# Patient Record
Sex: Female | Born: 1970 | Race: White | Hispanic: No | Marital: Married | Smoking: Never smoker
Health system: Southern US, Community
[De-identification: ages and names within clinical notes are randomized; demographics above are authoritative.]

## PROBLEM LIST (undated history)

## (undated) DIAGNOSIS — K219 Gastro-esophageal reflux disease without esophagitis: Secondary | ICD-10-CM

## (undated) DIAGNOSIS — F419 Anxiety disorder, unspecified: Secondary | ICD-10-CM

## (undated) DIAGNOSIS — T7840XA Allergy, unspecified, initial encounter: Secondary | ICD-10-CM

## (undated) DIAGNOSIS — J45909 Unspecified asthma, uncomplicated: Secondary | ICD-10-CM

## (undated) HISTORY — DX: Allergy, unspecified, initial encounter: T78.40XA

## (undated) HISTORY — PX: INNER EAR SURGERY: SHX679

## (undated) HISTORY — DX: Gastro-esophageal reflux disease without esophagitis: K21.9

## (undated) HISTORY — DX: Anxiety disorder, unspecified: F41.9

---

## 2004-12-30 ENCOUNTER — Ambulatory Visit: Payer: Self-pay

## 2005-10-20 ENCOUNTER — Ambulatory Visit: Payer: Self-pay | Admitting: Unknown Physician Specialty

## 2005-12-31 ENCOUNTER — Ambulatory Visit: Payer: Self-pay

## 2007-01-04 ENCOUNTER — Ambulatory Visit: Payer: Self-pay

## 2008-09-04 ENCOUNTER — Ambulatory Visit: Payer: Self-pay | Admitting: Obstetrics and Gynecology

## 2009-10-30 ENCOUNTER — Ambulatory Visit: Payer: Self-pay

## 2010-11-05 ENCOUNTER — Ambulatory Visit: Payer: Self-pay

## 2010-12-10 ENCOUNTER — Ambulatory Visit: Payer: Self-pay | Admitting: Internal Medicine

## 2010-12-23 ENCOUNTER — Ambulatory Visit: Payer: Self-pay | Admitting: Gastroenterology

## 2011-11-12 ENCOUNTER — Ambulatory Visit: Payer: Self-pay | Admitting: Internal Medicine

## 2012-06-08 ENCOUNTER — Other Ambulatory Visit: Payer: Self-pay | Admitting: Internal Medicine

## 2012-06-08 ENCOUNTER — Telehealth: Payer: Self-pay | Admitting: Internal Medicine

## 2012-06-08 MED ORDER — SCOPOLAMINE 1 MG/3DAYS TD PT72: 1.0000 | MEDICATED_PATCH | TRANSDERMAL | Status: DC

## 2012-06-08 NOTE — Telephone Encounter (Signed)
Ok to fill scopolamine patches - change q 72 hours.   One box with no refills.

## 2012-06-08 NOTE — Telephone Encounter (Signed)
I called this patient and explained to her - not a lot of other options for sea sickness/motion sickness.  Discussed with pharmacy.  Only other option is Meclizine.  Discussed possible side effects and dosing of meds.  Will call back if problems.

## 2012-06-08 NOTE — Telephone Encounter (Signed)
Patient advised via telephone, Rx sent to [facility] pharmacy.

## 2012-06-08 NOTE — Telephone Encounter (Signed)
Pt is needing a refill on her Motion sickness patches. She is leaving next week for a cruise and is needing some.

## 2012-06-08 NOTE — Telephone Encounter (Signed)
Pt called stating she went to pick up rx and it was $98 and wanted to know if you could give her something less expensive

## 2012-07-20 ENCOUNTER — Encounter: Payer: Self-pay | Admitting: Internal Medicine

## 2012-07-20 ENCOUNTER — Ambulatory Visit (INDEPENDENT_AMBULATORY_CARE_PROVIDER_SITE_OTHER): Payer: 59 | Admitting: Internal Medicine

## 2012-07-20 VITALS — BP 109/76 | HR 72 | Temp 98.0°F | Ht 64.0 in | Wt 173.0 lb

## 2012-07-20 DIAGNOSIS — N951 Menopausal and female climacteric states: Secondary | ICD-10-CM

## 2012-07-20 DIAGNOSIS — Z139 Encounter for screening, unspecified: Secondary | ICD-10-CM

## 2012-07-20 MED ORDER — ESCITALOPRAM OXALATE 20 MG PO TABS: ORAL_TABLET | ORAL | Status: DC

## 2012-07-20 MED ORDER — ALPRAZOLAM 0.25 MG PO TABS: 0.2500 mg | ORAL_TABLET | Freq: Every day | ORAL | Status: DC | PRN

## 2012-07-20 NOTE — Progress Notes (Signed)
  Subjective:    Patient ID: Tasha Woods, female    DOB: [birth year], 41 y.o.   MRN: [id number]  HPI 41 year old female with past history of Crohn's disease who comes in today for a scheduled follow up. States that her son was recently diagnosed with ADHD.  Coping with this.  Feels overwhelmed at times, but overall feels she is handling things relatively well.  Feels the current dose of Lexapro is adequate.  She does feel that occasionally she may need a xanax.  Has taken this previously and tolerated.  Is having some increased drainage now.  Occasional ear pressure, but no significant ear pain.  No sinus pressure or fever.  No chest congestion or sob.    Past Medical History  Diagnosis Date  . Crohn's disease 55    Dr Tiffany Kocher    Review of Systems Patient denies any headache, lightheadedness or dizziness.  Increased drainage as outlined.   No chest pain, tightness or palpitations.  No increased shortness of breath, cough or congestion.  No nausea or vomiting.  No acid reflux.   No abdominal pain or cramping.  No bowel change, such as diarrhea, constipation, BRBPR or melana.  No urine change.  Increased stress as outlined.       Objective:   Physical Exam Filed Vitals:   07/20/12 0812  BP: 109/76  Pulse: 72  Temp: 98 F (84.17 C)   41 year old female in no acute distress.   HEENT:  Nares - clear except slightly erythematous turbinates.  No significant tenderness to palpation over the sinuses.  OP- without lesions or erythema.  NECK:  Supple, nontender.  No audible abdominal bruit.   HEART:  Appears to be regular. LUNGS:  Without crackles or wheezing audible.  Respirations even and unlabored.   RADIAL PULSE:  Equal bilaterally.  ABDOMEN:  Soft, nontender.  No audible abdominal bruit.   EXTREMITIES:  No increased edema to be present.                     Assessment & Plan:  INCREASED PSYCHOSOCIAL STRESSORS.  Continue Lexapro at current dosage.  Will give her a prescription for Xanax  .73m to take daily prn.  (#20 with no refills).  Discussed counseling.  She feels she has adequate support.    INCREASED DRAINAGE.  Flonase nasal spray as directed.  Saline flushes as directed.  Hold on abx.  Follow.  Notify me if any worsening symptoms.   GYN.  Pap negative 11/24/11.    GI.  Currently asymptomatic.  No family history of colon cancer.  Has been previously diagnosed with Crohn's.  Discussed need for colon follow up.  Asymptomatic.   Consider referral to Dr ETiffany Kocherwhen agreeable.     HEALTH MAINTENANCE.  Physical 11/24/11.  Mammogram 11/02/11- [facility].  Schedule follow up mammogram.

## 2012-07-20 NOTE — Patient Instructions (Addendum)
It was nice seeing you today.  I am going to give you xanax to have as needed - as we discussed.  Let me know if you need anything else.

## 2012-07-22 ENCOUNTER — Other Ambulatory Visit: Payer: Self-pay | Admitting: *Deleted

## 2012-07-25 MED ORDER — ALPRAZOLAM 0.25 MG PO TABS: 0.2500 mg | ORAL_TABLET | Freq: Every day | ORAL | Status: DC | PRN

## 2012-07-27 ENCOUNTER — Telehealth: Payer: Self-pay | Admitting: *Deleted

## 2012-07-27 NOTE — Telephone Encounter (Signed)
Called patient to check and see if she had ever received her rx, patient stated that she had.

## 2012-08-07 ENCOUNTER — Encounter: Payer: Self-pay | Admitting: Internal Medicine

## 2012-08-07 DIAGNOSIS — N951 Menopausal and female climacteric states: Secondary | ICD-10-CM | POA: Insufficient documentation

## 2012-08-07 NOTE — Assessment & Plan Note (Signed)
Doing better on the Estradiol.  On Lexapro.  Follow.

## 2012-08-28 ENCOUNTER — Telehealth: Payer: Self-pay | Admitting: Internal Medicine

## 2012-08-28 NOTE — Telephone Encounter (Signed)
I reviewed her outside records.  She is currently scheduled for a physical here 11/17/12.  Her last physical was 11/24/11.  Please move her physical to after 3/226/14 and notify pt had to move for insurance - so physical would be more than one year.  Thanks.

## 2012-08-29 NOTE — Telephone Encounter (Signed)
rescheduled

## 2012-11-14 ENCOUNTER — Ambulatory Visit: Payer: Self-pay | Admitting: Internal Medicine

## 2012-11-17 ENCOUNTER — Encounter: Payer: 59 | Admitting: Internal Medicine

## 2012-11-24 ENCOUNTER — Ambulatory Visit (INDEPENDENT_AMBULATORY_CARE_PROVIDER_SITE_OTHER): Payer: 59 | Admitting: Internal Medicine

## 2012-11-24 ENCOUNTER — Encounter: Payer: Self-pay | Admitting: Internal Medicine

## 2012-11-24 ENCOUNTER — Other Ambulatory Visit (HOSPITAL_COMMUNITY)
Admission: RE | Admit: 2012-11-24 | Discharge: 2012-11-24 | Disposition: A | Discharge status: Home / Self Care | Payer: 59 | Source: Ambulatory Visit | Attending: Internal Medicine | Admitting: Internal Medicine

## 2012-11-24 VITALS — BP 100/60 | HR 70 | Temp 98.4°F | Ht 64.0 in | Wt 174.5 lb

## 2012-11-24 DIAGNOSIS — Z13220 Encounter for screening for lipoid disorders: Secondary | ICD-10-CM

## 2012-11-24 DIAGNOSIS — Z9109 Other allergy status, other than to drugs and biological substances: Secondary | ICD-10-CM

## 2012-11-24 DIAGNOSIS — Z124 Encounter for screening for malignant neoplasm of cervix: Secondary | ICD-10-CM

## 2012-11-24 DIAGNOSIS — Z1151 Encounter for screening for human papillomavirus (HPV): Secondary | ICD-10-CM | POA: Insufficient documentation

## 2012-11-24 DIAGNOSIS — N951 Menopausal and female climacteric states: Secondary | ICD-10-CM

## 2012-11-24 DIAGNOSIS — Z01419 Encounter for gynecological examination (general) (routine) without abnormal findings: Secondary | ICD-10-CM | POA: Insufficient documentation

## 2012-11-24 MED ORDER — FLUTICASONE PROPIONATE 50 MCG/ACT NA SUSP: 2.0000 | Freq: Every day | NASAL | Status: DC

## 2012-11-27 ENCOUNTER — Encounter: Payer: Self-pay | Admitting: Internal Medicine

## 2012-11-27 DIAGNOSIS — Z9109 Other allergy status, other than to drugs and biological substances: Secondary | ICD-10-CM | POA: Insufficient documentation

## 2012-11-27 NOTE — Progress Notes (Signed)
  Subjective:    Patient ID: Tasha Woods, female    DOB: December 17, 1970, 42 y.o.   MRN: [id number]  HPI 42 year old female with past history of Crohn's disease who comes in today for her complete physical exam.  Feels she is handling things relatively well.  Feels the current dose of Lexapro is adequate.  She has xanax if needed.  Has not taken any since her last visit.  Breathing stable.  No chest pain or tightness.  No nausea or vomiting.  No bowel change.  No acid reflux.    Past Medical History  Diagnosis Date  . Crohn's disease 49    Dr Tiffany Kocher    Current Outpatient Prescriptions on File Prior to Visit  Medication Sig Dispense Refill  . ALPRAZolam (XANAX) 0.25 MG tablet Take 1 tablet (0.25 mg total) by mouth daily as needed.  20 tablet  0  . escitalopram (LEXAPRO) 20 MG tablet 1/2 tablet q day  90 tablet  1  . estradiol (ESTRACE) 1 MG tablet Take 1 mg by mouth daily.       No current facility-administered medications on file prior to visit.    Review of Systems Patient denies any headache, lightheadedness or dizziness.  No increased sinus or allergy symptoms.  No chest pain, tightness or palpitations.  No increased shortness of breath, cough or congestion.  No nausea or vomiting.  No acid reflux.   No abdominal pain or cramping.  No bowel change, such as diarrhea, constipation, BRBPR or melana.  No urine change.  Increased stress as outlined.  Feels she is handling things relatively well.  Feels the current dose of Lexapro is adequate.       Objective:   Physical Exam  Filed Vitals:   11/24/12 0942  BP: 100/60  Pulse: 70  Temp: 98.4 F (53.51 C)   42 year old female in no acute distress.   HEENT:  Nares- clear.  Oropharynx - without lesions. NECK:  Supple.  Nontender.  No audible bruit.  HEART:  Appears to be regular. LUNGS:  No crackles or wheezing audible.  Respirations even and unlabored.  RADIAL PULSE:  Equal bilaterally.    BREASTS:  No nipple discharge or nipple  retraction present.  Could not appreciate any distinct nodules or axillary adenopathy.  ABDOMEN:  Soft, nontender.  Bowel sounds present and normal.  No audible abdominal bruit.  GU:  Normal external genitalia.  Vaginal vault without lesions.  Cervix identified.  Pap performed. Could not appreciate any adnexal masses or tenderness.   RECTAL:  Not performed.    EXTREMITIES:  No increased edema present.  DP pulses palpable and equal bilaterally.              Assessment & Plan:  INCREASED PSYCHOSOCIAL STRESSORS.  Continue Lexapro at current dosage.  She feels she has adequate support.  Does not feel she needs anything more at this point.  Follow.  Has xanax if needed.    GI.  Currently asymptomatic.  No family history of colon cancer.  Has been previously diagnosed with Crohn's.  Discussed need for colon follow up.  Asymptomatic.   Contact GI when due follow up.      HEALTH MAINTENANCE.  Physical today.  Mammogram 11/02/11- [facility].  Schedule follow up mammogram.

## 2012-11-27 NOTE — Assessment & Plan Note (Signed)
Doing better.  Follow.

## 2012-11-27 NOTE — Assessment & Plan Note (Signed)
Controlled.  Follow.

## 2012-11-28 ENCOUNTER — Encounter: Payer: Self-pay | Admitting: Internal Medicine

## 2012-11-29 ENCOUNTER — Encounter: Payer: Self-pay | Admitting: Internal Medicine

## 2012-12-13 ENCOUNTER — Other Ambulatory Visit: Payer: Self-pay | Admitting: *Deleted

## 2012-12-13 MED ORDER — ESCITALOPRAM OXALATE 20 MG PO TABS: ORAL_TABLET | ORAL | Status: DC

## 2012-12-19 ENCOUNTER — Encounter: Payer: Self-pay | Admitting: *Deleted

## 2012-12-22 ENCOUNTER — Telehealth: Payer: Self-pay | Admitting: *Deleted

## 2012-12-22 NOTE — Telephone Encounter (Signed)
Spoke with patient & informed her that labs done on 4/17 through [facility] were all okay

## 2013-07-07 ENCOUNTER — Other Ambulatory Visit: Payer: Self-pay | Admitting: Internal Medicine

## 2013-11-23 ENCOUNTER — Other Ambulatory Visit: Payer: Self-pay | Admitting: Internal Medicine

## 2014-01-05 ENCOUNTER — Ambulatory Visit: Payer: Self-pay | Admitting: Internal Medicine

## 2014-01-05 LAB — HM MAMMOGRAPHY: HM Mammogram: NEGATIVE

## 2014-01-09 ENCOUNTER — Encounter: Payer: Self-pay | Admitting: Internal Medicine

## 2014-01-29 ENCOUNTER — Encounter: Payer: Self-pay | Admitting: Internal Medicine

## 2014-02-19 ENCOUNTER — Encounter (INDEPENDENT_AMBULATORY_CARE_PROVIDER_SITE_OTHER): Payer: Self-pay

## 2014-02-19 ENCOUNTER — Encounter: Payer: Self-pay | Admitting: Internal Medicine

## 2014-02-19 ENCOUNTER — Ambulatory Visit (INDEPENDENT_AMBULATORY_CARE_PROVIDER_SITE_OTHER): Payer: 59 | Admitting: Internal Medicine

## 2014-02-19 VITALS — BP 130/80 | HR 73 | Temp 98.2°F | Ht 63.5 in | Wt 180.5 lb

## 2014-02-19 DIAGNOSIS — N951 Menopausal and female climacteric states: Secondary | ICD-10-CM

## 2014-02-19 DIAGNOSIS — K5090 Crohn's disease, unspecified, without complications: Secondary | ICD-10-CM

## 2014-02-19 DIAGNOSIS — Z9109 Other allergy status, other than to drugs and biological substances: Secondary | ICD-10-CM

## 2014-02-19 MED ORDER — ESCITALOPRAM OXALATE 20 MG PO TABS: 10.0000 mg | ORAL_TABLET | Freq: Every day | ORAL | Status: DC

## 2014-02-19 NOTE — Progress Notes (Signed)
Pre visit review using our clinic review tool, if applicable. No additional management support is needed unless otherwise documented below in the visit note. 

## 2014-02-25 ENCOUNTER — Encounter: Payer: Self-pay | Admitting: Internal Medicine

## 2014-02-25 NOTE — Assessment & Plan Note (Signed)
Controlled.  Follow.

## 2014-02-25 NOTE — Progress Notes (Signed)
  Subjective:    Patient ID: Tasha Woods, female    DOB: 08-17-1971, 43 y.o.   MRN: [id number]  HPI 43 year old female with past history of Crohn's disease who comes in today for her complete physical exam.  Feels she is handling things relatively well.  Feels the current dose of Lexapro is adequate.  She has xanax if needed.  Rarely uses. Breathing stable.  No chest pain or tightness.  No nausea or vomiting.  No bowel change.  No acid reflux.  Overall feels things are relatively stable.     Past Medical History  Diagnosis Date  . Crohn's disease 66    Dr Tiffany Kocher    Current Outpatient Prescriptions on File Prior to Visit  Medication Sig Dispense Refill  . ALPRAZolam (XANAX) 0.25 MG tablet Take 1 tablet (0.25 mg total) by mouth daily as needed.  20 tablet  0  . fluticasone (FLONASE) 50 MCG/ACT nasal spray Use 2 sprays in each  nostril daily  48 g  0   No current facility-administered medications on file prior to visit.    Review of Systems Patient denies any headache, lightheadedness or dizziness.  No increased sinus or allergy symptoms.  No chest pain, tightness or palpitations.  No increased shortness of breath, cough or congestion.  No nausea or vomiting.  No acid reflux.  No abdominal pain or cramping.  No bowel change, such as diarrhea, constipation, BRBPR or melana.  No urine change.  Increased stress as outlined.  Feels she is handling things relatively well.  Feels the current dose of Lexapro is adequate.  She is off estrogen.       Objective:   Physical Exam  Filed Vitals:   02/19/14 1447  BP: 130/80  Pulse: 73  Temp: 98.2 F (36.8 C)   Blood pressure recheck:  100/68, pulse 48  43 year old female in no acute distress.   HEENT:  Nares- clear.  Oropharynx - without lesions. NECK:  Supple.  Nontender.  No audible bruit.  HEART:  Appears to be regular. LUNGS:  No crackles or wheezing audible.  Respirations even and unlabored.  RADIAL PULSE:  Equal bilaterally.     BREASTS:  No nipple discharge or nipple retraction present.  Could not appreciate any distinct nodules or axillary adenopathy.  ABDOMEN:  Soft, nontender.  Bowel sounds present and normal.  No audible abdominal bruit.  GU:  Not performed.      EXTREMITIES:  No increased edema present.  DP pulses palpable and equal bilaterally.              Assessment & Plan:  INCREASED PSYCHOSOCIAL STRESSORS.  Continue Lexapro at current dosage.  She feels she has adequate support.  Does not feel she needs anything more at this point.  Follow.  Has xanax if needed.    GI.  Currently asymptomatic.  No family history of colon cancer.  Has been previously diagnosed with Crohn's.  Discussed need for colon follow up.   Contact GI for follow up.       HEALTH MAINTENANCE.  Physical today.  Mammogram 01/05/14- [facility].  Refer to GI as outlined.    I spent 25 minutes with the patient and more than 50% of the time was spent in consultation regarding the above.

## 2014-02-25 NOTE — Assessment & Plan Note (Signed)
Off estrogen.  Still some hot flashes.  Stable.  Follow.

## 2014-02-28 ENCOUNTER — Other Ambulatory Visit: Payer: Self-pay | Admitting: Internal Medicine

## 2014-02-28 LAB — HEPATIC FUNCTION PANEL
ALT: 37 U/L — AB (ref 7–35)
AST: 30 U/L (ref 13–35)
Alkaline Phosphatase: 97 U/L (ref 25–125)
Bilirubin, Total: 0.4 mg/dL

## 2014-02-28 LAB — BASIC METABOLIC PANEL
BUN: 13 mg/dL (ref 4–21)
Creatinine: 0.8 mg/dL (ref 0.5–1.1)
Glucose: 98 mg/dL
Potassium: 4.7 mmol/L (ref 3.4–5.3)
Sodium: 142 mmol/L (ref 137–147)

## 2014-02-28 LAB — CBC AND DIFFERENTIAL
HCT: 40 % (ref 36–46)
Hemoglobin: 13.3 g/dL (ref 12.0–16.0)
Neutrophils Absolute: 51 /uL
Platelets: 226 10*3/uL (ref 150–399)
WBC: 6 10^3/mL

## 2014-02-28 LAB — TSH: TSH: 3.76 u[IU]/mL (ref 0.41–5.90)

## 2014-03-05 ENCOUNTER — Telehealth: Payer: Self-pay | Admitting: Internal Medicine

## 2014-03-05 ENCOUNTER — Other Ambulatory Visit: Payer: Self-pay | Admitting: *Deleted

## 2014-03-05 MED ORDER — ESCITALOPRAM OXALATE 20 MG PO TABS: 10.0000 mg | ORAL_TABLET | Freq: Every day | ORAL | Status: DC

## 2014-03-05 NOTE — Telephone Encounter (Signed)
Notify pt that I reviewed her recent lab results and her hgb is normal.  Her kidney function, potassium and thyroid function - wnl.  One liver test is slightly increased.  I would like to recheck this lab within the next 1-2 weeks just to confirm stable.  Gets labs at [facility] I believe.

## 2014-03-06 ENCOUNTER — Encounter: Payer: Self-pay | Admitting: *Deleted

## 2014-03-06 NOTE — Telephone Encounter (Signed)
Letter & lab slip mailed

## 2014-03-29 ENCOUNTER — Telehealth: Payer: Self-pay | Admitting: Internal Medicine

## 2014-03-29 ENCOUNTER — Encounter: Payer: Self-pay | Admitting: *Deleted

## 2014-03-29 NOTE — Telephone Encounter (Signed)
Letter & [facility] slip mailed to patient

## 2014-03-29 NOTE — Telephone Encounter (Signed)
Notify pt that one of her liver function test is slightly elevated.  Stable, but slightly elevated.  Remainder of liver function tests are wnl.  We will follow.  Recheck liver panel in one month.  (gets labs at [facility]).  Will sign form and place in your box.

## 2014-04-10 ENCOUNTER — Encounter: Payer: Self-pay | Admitting: Internal Medicine

## 2014-04-10 ENCOUNTER — Telehealth: Payer: Self-pay | Admitting: *Deleted

## 2014-04-10 NOTE — Telephone Encounter (Signed)
Pt called requesting clarification on whether she was to have a 3rd Hepatic Function Test drawn or if the letter was sent in error.  Pt was advised as per chart review she is to have lab drawn in one month.

## 2014-04-11 ENCOUNTER — Ambulatory Visit (INDEPENDENT_AMBULATORY_CARE_PROVIDER_SITE_OTHER): Payer: 59 | Admitting: Adult Health

## 2014-04-11 ENCOUNTER — Telehealth: Payer: Self-pay | Admitting: Internal Medicine

## 2014-04-11 VITALS — BP 124/78 | HR 75 | Temp 97.9°F | Resp 14 | Ht 63.5 in | Wt 178.5 lb

## 2014-04-11 DIAGNOSIS — R1011 Right upper quadrant pain: Secondary | ICD-10-CM | POA: Insufficient documentation

## 2014-04-11 NOTE — Progress Notes (Signed)
Pre visit review using our clinic review tool, if applicable. No additional management support is needed unless otherwise documented below in the visit note. 

## 2014-04-11 NOTE — Telephone Encounter (Signed)
OV with Raquel at 10:15

## 2014-04-11 NOTE — Telephone Encounter (Signed)
Noted  

## 2014-04-11 NOTE — Progress Notes (Signed)
Patient ID: Tasha Woods, female   DOB: May 07, 1971, 43 y.o.   MRN: [id number]    Subjective:    Patient ID: Tasha Woods, female    DOB: May 07, 1971, 43 y.o.   MRN: [id number]  HPI  Pt is a pleasant 43 y/o female who presents with RUQ pain that radiates to her back. [facility]/14 for what she reports as low back pain. Shortly afterward her pain began to radiate as described above. Worse with lying down. Nausea on Monday. Does not notice any worsening symptoms following a meal.   Past Medical History  Diagnosis Date  . Crohn's disease 701991    Dr Markham JordanElliot    Current Outpatient Prescriptions on File Prior to Visit  Medication Sig Dispense Refill  . ALPRAZolam (XANAX) 0.25 MG tablet Take 1 tablet (0.25 mg total) by mouth daily as needed.  20 tablet  0  . escitalopram (LEXAPRO) 20 MG tablet Take 0.5 tablets (10 mg total) by mouth daily.  45 tablet  3  . fluticasone (FLONASE) 50 MCG/ACT nasal spray Use 2 sprays in each  nostril daily  48 g  0  . Multiple Vitamins-Minerals (MULTIVITAMIN PO) Take by mouth daily.       No current facility-administered medications on file prior to visit.     Review of Systems  Constitutional: Negative for fever and chills.  Gastrointestinal: Positive for nausea and abdominal pain. Negative for vomiting, diarrhea, constipation and blood in stool.  All other systems reviewed and are negative.      Objective:  BP 124/78  Pulse 75  Temp(Src) 97.9 F (36.6 C) (Oral)  Resp 14  Wt 178 lb 8 oz (80.967 kg)  SpO2 96%   Physical Exam  Constitutional: She is oriented to person, place, and time. No distress.  Cardiovascular: Normal rate and regular rhythm.   Pulmonary/Chest: Effort normal. No respiratory distress.  Abdominal: Soft. Bowel sounds are normal. She exhibits no mass. There is tenderness. There is guarding. There is no rebound.  Positive murphy's sign on exam  Musculoskeletal: Normal range of motion.  Neurological: She is  alert and oriented to person, place, and time.  Skin: Skin is warm and dry.  Psychiatric: She has a normal mood and affect. Her behavior is normal. Judgment and thought content normal.      Assessment & Plan:   1. RUQ pain Her symptoms are suspicious for gallstones. I will check labs and send for ultrasound to r/o. - US Abdomen Limited RUQ; Future - Lipase - Hepatic function panel - CBC w/Diff - Basic Metabolic Panel (BMET) - Amylase

## 2014-04-11 NOTE — Telephone Encounter (Signed)
Patient Information:  Caller Name: [place]  Phone: [phone]  Patient: Tasha Woods, Tasha Woods  Gender: Female  DOB: 04-Jul-1971  Age: 43 Years  PCP: Einar Pheasant  Pregnant: No  Office Follow Up:  Does the office need to follow up with this patient?: No  Instructions For The Office: N/A  RN Note:  LMP: Farley Ly IUD. Patient states she developed Right Upper quadrant abdominal pain, located under her right anterior rib cage that radiates around to right side of back, onset 03/30/14. Patient states she was seen in the Urgent Care 04/02/14 and prescribed Prednisone, Tramadol and Orphenzadrine for possible muscular strain, without relief. Patient states she has also been seen by a Chiropracter without relief. Patient states she has tenderness in the RUQ of abdomen with palpation. Patient states pain is constant and describes as "sharp." Patient states patient is currently at a "5" on a 1-10 scale. Patient states she had nausea 04/09/14 but denies nausea at present. States her bowel movements have been soft, brown. States she had bowel movement X 3 04/10/14. Afebrile. Care advice and diet advice given per guidelines. Call back parameters reviewed. Patient verbalizes understanding. No appts. available in [place] [facility]. RN spoke with [place], in office. Appt. scheduled, per [facility] in office, for 04/11/14 1015 with Raquel Ray. Patient informed of above and agreeable.   Symptoms  Reason For Call & Symptoms: RUQ abdominal pain radiating into right back  Reviewed Health History In EMR: Yes  Reviewed Medications In EMR: Yes  Reviewed Allergies In EMR: Yes  Reviewed Surgeries / Procedures: Yes  Date of Onset of Symptoms: 03/30/2014  Treatments Tried: Chiropracter, Urgent Care 04/02/14, Prednisone, Orphenzadrine Tramadol  Treatments Tried Worked: No OB / GYN:  LMP: Unknown  Guideline(s) Used:  Abdominal Pain - Female  Abdominal Pain - Upper  Disposition Per Guideline:   Go to ED Now (or to  Office with PCP Approval)  Reason For Disposition Reached:   Constant abdominal pain lasting > 2 hours  Advice Given:  Fluids:   Sip clear fluids only (e.g., water, flat soft drinks, or half-strength fruit juice) until the pain is gone for 2 hours. Then slowly return to a regular diet.  Diet:  Slowly advance diet from clear liquids to a bland diet.  Avoid alcohol or caffeinated beverages.  Avoid greasy or fatty foods.  Call Back If:  You become worse.  Patient Will Follow Care Advice:  YES  Appointment Scheduled:  04/11/2014 10:15:00 Appointment Scheduled Provider:  Charolette Forward

## 2014-04-11 NOTE — Patient Instructions (Signed)
  Please have labs drawn and see Hoyle Sauer prior to leaving the office to schedule your ultrasound.  We will notify you of results once they are available.

## 2014-04-12 ENCOUNTER — Encounter: Payer: Self-pay | Admitting: *Deleted

## 2014-04-12 LAB — HEPATIC FUNCTION PANEL
ALT: 26 IU/L (ref 0–32)
AST: 21 IU/L (ref 0–40)
Albumin: 4.8 g/dL (ref 3.5–5.5)
Alkaline Phosphatase: 107 IU/L (ref 39–117)
Bilirubin, Direct: 0.11 mg/dL (ref 0.00–0.40)
Total Bilirubin: 0.4 mg/dL (ref 0.0–1.2)
Total Protein: 7.1 g/dL (ref 6.0–8.5)

## 2014-04-12 LAB — CBC WITH DIFFERENTIAL/PLATELET
Basophils Absolute: 0 10*3/uL (ref 0.0–0.2)
Basos: 0 %
Eos: 4 %
Eosinophils Absolute: 0.3 10*3/uL (ref 0.0–0.4)
HCT: 43.4 % (ref 34.0–46.6)
Hemoglobin: 14.1 g/dL (ref 11.1–15.9)
Immature Grans (Abs): 0 10*3/uL (ref 0.0–0.1)
Immature Granulocytes: 0 %
Lymphocytes Absolute: 2.3 10*3/uL (ref 0.7–3.1)
Lymphs: 28 %
MCH: 28.3 pg (ref 26.6–33.0)
MCHC: 32.5 g/dL (ref 31.5–35.7)
MCV: 87 fL (ref 79–97)
Monocytes Absolute: 0.5 10*3/uL (ref 0.1–0.9)
Monocytes: 6 %
Neutrophils Absolute: 5 10*3/uL (ref 1.4–7.0)
Neutrophils Relative %: 62 %
RBC: 4.99 x10E6/uL (ref 3.77–5.28)
RDW: 13.2 % (ref 12.3–15.4)
WBC: 8.1 10*3/uL (ref 3.4–10.8)

## 2014-04-12 LAB — BASIC METABOLIC PANEL
BUN/Creatinine Ratio: 19 (ref 9–23)
BUN: 14 mg/dL (ref 6–24)
CO2: 21 mmol/L (ref 18–29)
Calcium: 9.7 mg/dL (ref 8.7–10.2)
Chloride: 101 mmol/L (ref 97–108)
Creatinine, Ser: 0.74 mg/dL (ref 0.57–1.00)
GFR calc Af Amer: 114 mL/min/{1.73_m2} (ref >59)
GFR calc non Af Amer: 99 mL/min/{1.73_m2} (ref >59)
Glucose: 89 mg/dL (ref 65–99)
Potassium: 4.6 mmol/L (ref 3.5–5.2)
Sodium: 139 mmol/L (ref 134–144)

## 2014-04-12 LAB — AMYLASE: Amylase: 51 U/L (ref 31–124)

## 2014-04-12 LAB — LIPASE: Lipase: 23 U/L (ref 0–59)

## 2014-04-17 ENCOUNTER — Ambulatory Visit: Payer: Self-pay | Admitting: Adult Health

## 2014-04-19 ENCOUNTER — Encounter: Payer: Self-pay | Admitting: Internal Medicine

## 2014-04-21 NOTE — Telephone Encounter (Signed)
See other my chart message regarding f/u.

## 2014-04-23 ENCOUNTER — Ambulatory Visit: Payer: 59 | Admitting: Internal Medicine

## 2014-04-23 ENCOUNTER — Emergency Department: Payer: Self-pay | Admitting: Emergency Medicine

## 2014-04-23 ENCOUNTER — Other Ambulatory Visit: Payer: 59 | Admitting: Internal Medicine

## 2014-04-23 LAB — CBC WITH DIFFERENTIAL/PLATELET
Basophil #: 0.1 10*3/uL (ref 0.0–0.1)
Basophil %: 0.8 %
Eosinophil #: 0.2 10*3/uL (ref 0.0–0.7)
Eosinophil %: 3.3 %
HCT: 41.9 % (ref 35.0–47.0)
HGB: 14 g/dL (ref 12.0–16.0)
Lymphocyte #: 2.2 10*3/uL (ref 1.0–3.6)
Lymphocyte %: 34 %
MCH: 28.4 pg (ref 26.0–34.0)
MCHC: 33.5 g/dL (ref 32.0–36.0)
MCV: 85 fL (ref 80–100)
Monocyte #: 0.4 x10 3/mm (ref 0.2–0.9)
Monocyte %: 6 %
Neutrophil #: 3.6 10*3/uL (ref 1.4–6.5)
Neutrophil %: 55.9 %
Platelet: 250 10*3/uL (ref 150–440)
RBC: 4.94 10*6/uL (ref 3.80–5.20)
RDW: 13.3 % (ref 11.5–14.5)
WBC: 6.5 10*3/uL (ref 3.6–11.0)

## 2014-04-23 LAB — URINALYSIS, COMPLETE
Bacteria: NONE SEEN
Bilirubin,UR: NEGATIVE
Blood: NEGATIVE
Glucose,UR: NEGATIVE mg/dL (ref 0–75)
Ketone: NEGATIVE
Leukocyte Esterase: NEGATIVE
Nitrite: NEGATIVE
Ph: 7 (ref 4.5–8.0)
Protein: NEGATIVE
RBC,UR: NONE SEEN /HPF (ref 0–5)
Specific Gravity: 1.004 (ref 1.003–1.030)
Squamous Epithelial: 1
WBC UR: <1 /HPF (ref 0–5)

## 2014-04-23 LAB — COMPREHENSIVE METABOLIC PANEL
Albumin: 4.2 g/dL (ref 3.4–5.0)
Alkaline Phosphatase: 107 U/L
Anion Gap: 9 (ref 7–16)
BUN: 11 mg/dL (ref 7–18)
Bilirubin,Total: 0.4 mg/dL (ref 0.2–1.0)
Calcium, Total: 9.1 mg/dL (ref 8.5–10.1)
Chloride: 108 mmol/L — ABNORMAL HIGH (ref 98–107)
Co2: 24 mmol/L (ref 21–32)
Creatinine: 0.8 mg/dL (ref 0.60–1.30)
EGFR (African American): >60
EGFR (Non-African Amer.): >60
Glucose: 88 mg/dL (ref 65–99)
Osmolality: 280 (ref 275–301)
Potassium: 4.4 mmol/L (ref 3.5–5.1)
SGOT(AST): 44 U/L — ABNORMAL HIGH (ref 15–37)
SGPT (ALT): 48 U/L
Sodium: 141 mmol/L (ref 136–145)
Total Protein: 7.9 g/dL (ref 6.4–8.2)

## 2014-04-23 LAB — LIPASE, BLOOD: Lipase: 316 U/L (ref 73–393)

## 2014-04-23 LAB — TROPONIN I: Troponin-I: <0.02 ng/mL

## 2014-04-24 ENCOUNTER — Other Ambulatory Visit: Payer: 59 | Admitting: Internal Medicine

## 2014-04-30 ENCOUNTER — Ambulatory Visit: Payer: Self-pay | Admitting: Unknown Physician Specialty

## 2014-04-30 HISTORY — PX: UPPER GI ENDOSCOPY: SHX6162

## 2014-04-30 HISTORY — PX: COLONOSCOPY: SHX174

## 2014-04-30 LAB — HM COLONOSCOPY: HM Colonoscopy: NORMAL

## 2014-05-03 LAB — PATHOLOGY REPORT

## 2014-05-10 ENCOUNTER — Encounter: Payer: Self-pay | Admitting: Adult Health

## 2014-05-10 ENCOUNTER — Encounter: Payer: Self-pay | Admitting: Internal Medicine

## 2014-05-10 ENCOUNTER — Ambulatory Visit: Payer: Self-pay | Admitting: Unknown Physician Specialty

## 2014-05-10 DIAGNOSIS — K2100 Gastro-esophageal reflux disease with esophagitis, without bleeding: Secondary | ICD-10-CM | POA: Insufficient documentation

## 2014-05-10 DIAGNOSIS — Z8371 Family history of colonic polyps: Secondary | ICD-10-CM | POA: Insufficient documentation

## 2014-05-10 DIAGNOSIS — Z83719 Family history of colon polyps, unspecified: Secondary | ICD-10-CM | POA: Insufficient documentation

## 2014-05-14 ENCOUNTER — Encounter: Payer: Self-pay | Admitting: Internal Medicine

## 2014-05-15 ENCOUNTER — Encounter: Payer: Self-pay | Admitting: *Deleted

## 2014-05-23 ENCOUNTER — Encounter: Payer: Self-pay | Admitting: General Surgery

## 2014-05-23 ENCOUNTER — Ambulatory Visit (INDEPENDENT_AMBULATORY_CARE_PROVIDER_SITE_OTHER): Payer: 59 | Admitting: General Surgery

## 2014-05-23 VITALS — BP 118/80 | HR 72 | Resp 14 | Ht 63.0 in | Wt 180.0 lb

## 2014-05-23 DIAGNOSIS — G8929 Other chronic pain: Secondary | ICD-10-CM

## 2014-05-23 DIAGNOSIS — R1011 Right upper quadrant pain: Secondary | ICD-10-CM

## 2014-05-23 DIAGNOSIS — K810 Acute cholecystitis: Secondary | ICD-10-CM

## 2014-05-23 NOTE — Progress Notes (Addendum)
Patient ID: Tasha Woods, female   DOB: 1971-06-17, 43 y.o.   MRN: [id number]  Chief Complaint  Patient presents with  . Abdominal Pain    evaluate gall bladder    HPI Tasha Woods is a 43 y.o. female.  Here today for evaluation of gall bladder. She states that about 9 weeks ago she started having right upper quadrant radiates to her back. Sharp pain that last about 30-45 minutes. It has awakened her from a sleep. Not associated with any particular foods, but more likely to occur after meals. Occurs daily and multiple times throughout the day. Her bowels are regular and daily. She does have sporadic and occasional diarrhea (once a week, pudding consistency) and some occasional nausea. She does have indigestion. She is tender on that right side. She does seem to think the pain is getting worse in severity and frequency of occurance. She uses tramadol during the day and the norco at night.  She had a HIDA scan done 05-10-14. An abdominal ultrasound was done 04-17-14 when she went to the ER.   HPI  Past Medical History  Diagnosis Date  . Anxiety   . GERD (gastroesophageal reflux disease)   . Allergy     Past Surgical History  Procedure Laterality Date  . Inner ear surgery      age 70  . Colonoscopy  04-30-14    Dr Vira Agar  . Upper gi endoscopy  04-30-14    Dr Vira Agar    Family History  Problem Relation Age of Onset  . Hypertension Father   . Breast cancer Neg Hx   . Colon cancer Neg Hx   . Colon polyps Mother     Social History History  Substance Use Topics  . Smoking status: Never Smoker   . Smokeless tobacco: Never Used  . Alcohol Use: Yes     Comment: occasionally    Allergies  Allergen Reactions  . Ceftin [Cefuroxime Axetil] Rash  . Erythromycin Rash  . Penicillins Rash  . Sulfa Antibiotics Rash    Current Outpatient Prescriptions  Medication Sig Dispense Refill  . ALPRAZolam (XANAX) 0.25 MG tablet Take 1 tablet (0.25 mg total) by mouth daily as needed.  20  tablet  0  . escitalopram (LEXAPRO) 20 MG tablet Take 0.5 tablets (10 mg total) by mouth daily.  45 tablet  3  . esomeprazole (NEXIUM) 40 MG capsule Take 40 mg by mouth daily at 12 noon.      . fluticasone (FLONASE) 50 MCG/ACT nasal spray Use 2 sprays in each  nostril daily  48 g  0  . HYDROcodone-acetaminophen (NORCO/VICODIN) 5-325 MG per tablet Take 1 tablet by mouth every 6 (six) hours as needed for moderate pain.      Marland Kitchen ondansetron (ZOFRAN) 4 MG tablet Take 4 mg by mouth every 8 (eight) hours as needed for nausea or vomiting.      . traMADol (ULTRAM) 50 MG tablet Take 50 mg by mouth every 6 (six) hours as needed.       No current facility-administered medications for this visit.    Review of Systems Review of Systems  Constitutional: Negative.   Respiratory: Negative.   Cardiovascular: Negative.   Gastrointestinal: Positive for nausea, abdominal pain and diarrhea.    Blood pressure 118/80, pulse 72, resp. rate 14, height 5' 3"  (1.6 m), weight 180 lb (81.647 kg).  Physical Exam Physical Exam  Constitutional: She is oriented to person, place, and time. She appears well-developed and  well-nourished.  Neck: Neck supple.  Cardiovascular: Normal rate, regular rhythm and normal heart sounds.   Pulmonary/Chest: Effort normal and breath sounds normal.  Abdominal: Soft. Normal appearance and bowel sounds are normal. There is tenderness in the right upper quadrant.    Tender right lateral abdomen at T8  Lymphadenopathy:    She has no cervical adenopathy.  Neurological: She is alert and oriented to person, place, and time.  Skin: Skin is warm and dry.    Data Reviewed GI nodes dated 04/04/2014 were reviewed. Notation that a diagnosis of Crohn's disease had been entertained 23 years ago, with no recurrent symptoms after discontinuing medication for 18 years ago.  Family history of colonic polyps noted.  Colonoscopy dated 04/30/2014 showed no evidence of inflammatory disease of the  terminal ileum. The colon was normal. Small internal hemorrhoids were noted. Upper endoscopy of the same date showed mild reflux gastritis and mild esophagitis. Biopsies of the duodenum were normal. No evidence of Barrett's epithelial changes.  Abdominal ultrasound dated 04/17/2014 was unremarkable. No gallbladder wall thickening. No gallstones. Common bile duct: 3 mm.  CT scan of the abdomen and pelvis dated 04/23/2014 suggested hepatic steatosis. No other findings.  HIDA scan dated 05/10/2014 showed normal uptake by the liver. Gallbladder ejection fraction 72%. Notation made of reproduction of pain 7/10 with CCK injection.  Laboratory studies dated 04/12/2014 showed a normal CBC with a hemoglobin of 14.1, white blood cell count 8100, normal differential, lipase 23, normal liver function studies, normal renal function with a creatinine of 0.7 for an estimated GFR of 100.  Assessment    Right upper quadrant pain clinically suggestive of biliary colic. Reproduction of symptoms during CCK injection.  Atypical right lateral chest wall pain difficult to explain based on imaging studies and clinical exam.    Plan    The patient has had a complete evaluation including upper and lower endoscopy as well as imaging studies. The reproduction of her symptoms with CCK infusion suggests the possibility of biliary colic. The patient has been advised that no guarantee can be made that her pain symptoms will be relieved with cholecystectomy. Considering her clinical symptoms, exam and HIDA scan results, it is not unreasonable to consider elective cholecystectomy.    Laparoscopic Cholecystectomy with Intraoperative Cholangiogram. The procedure, including it's potential risks and complications (including but not limited to infection, bleeding, injury to intra-abdominal organs or bile ducts, bile leak, poor cosmetic result, sepsis and death) were discussed with the patient in detail. Non-operative options,  including their inherent risks (acute calculous cholecystitis with possible choledocholithiasis or gallstone pancreatitis, with the risk of ascending cholangitis, sepsis, and death) were discussed as well. The patient expressed and understanding of what we discussed and wishes to proceed with laparoscopic cholecystectomy. The patient further understands that if it is technically not possible, or it is unsafe to proceed laparoscopically, that I will convert to an open cholecystectomy.  Patient's surgery has been scheduled for 06-08-14 at [facility].  PCP: Einar Pheasant Ref: Dr Laury Axon, [place] Gleason 05/24/2014, 10:20 AM

## 2014-05-23 NOTE — Patient Instructions (Addendum)
The patient is aware to call back for any questions or concerns.  Laparoscopic Cholecystectomy Laparoscopic cholecystectomy is surgery to remove the gallbladder. The gallbladder is located in the upper right part of the abdomen, behind the liver. It is a storage sac for bile produced in the liver. Bile aids in the digestion and absorption of fats. Cholecystectomy is often done for inflammation of the gallbladder (cholecystitis). This condition is usually caused by a buildup of gallstones (cholelithiasis) in your gallbladder. Gallstones can block the flow of bile, resulting in inflammation and pain. In severe cases, emergency surgery may be required. When emergency surgery is not required, you will have time to prepare for the procedure. Laparoscopic surgery is an alternative to open surgery. Laparoscopic surgery has a shorter recovery time. Your common bile duct may also need to be examined during the procedure. If stones are found in the common bile duct, they may be removed. LET [facility] CARE PROVIDER KNOW ABOUT:  Any allergies you have.  All medicines you are taking, including vitamins, herbs, eye drops, creams, and over-the-counter medicines.  Previous problems you or members of your family have had with the use of anesthetics.  Any blood disorders you have.  Previous surgeries you have had.  Medical conditions you have. RISKS AND COMPLICATIONS Generally, this is a safe procedure. However, as with any procedure, complications can occur. Possible complications include:  Infection.  Damage to the common bile duct, nerves, arteries, veins, or other internal organs such as the stomach, liver, or intestines.  Bleeding.  A stone may remain in the common bile duct.  A bile leak from the cyst duct that is clipped when your gallbladder is removed.  The need to convert to open surgery, which requires a larger incision in the abdomen. This may be necessary if your surgeon thinks it is not  safe to continue with a laparoscopic procedure. BEFORE THE PROCEDURE  Ask your health care provider about changing or stopping any regular medicines. You will need to stop taking aspirin or blood thinners at least 5 days prior to surgery.  Do not eat or drink anything after midnight the night before surgery.  Let your health care provider know if you develop a cold or other infectious problem before surgery. PROCEDURE   You will be given medicine to make you sleep through the procedure (general anesthetic). A breathing tube will be placed in your mouth.  When you are asleep, your surgeon will make several small cuts (incisions) in your abdomen.  A thin, lighted tube with a tiny camera on the end (laparoscope) is inserted through one of the small incisions. The camera on the laparoscope sends a picture to a TV screen in the operating room. This gives the surgeon a good view inside your abdomen.  A gas will be pumped into your abdomen. This expands your abdomen so that the surgeon has more room to perform the surgery.  Other tools needed for the procedure are inserted through the other incisions. The gallbladder is removed through one of the incisions.  After the removal of your gallbladder, the incisions will be closed with stitches, staples, or skin glue. AFTER THE PROCEDURE  You will be taken to a recovery area where your progress will be checked often.  You may be allowed to go home the same day if your pain is controlled and you can tolerate liquids. Document Released: 08/17/2005 Document Revised: 06/07/2013 Document Reviewed: 03/29/2013 [facility] Patient Information 2015 [place], [place]. This information is not  intended to replace advice given to you by your health care provider. Make sure you discuss any questions you have with your health care provider.  Patient's surgery has been scheduled for 06-08-14 at [facility].

## 2014-05-24 ENCOUNTER — Other Ambulatory Visit: Payer: Self-pay | Admitting: General Surgery

## 2014-05-24 DIAGNOSIS — G8929 Other chronic pain: Secondary | ICD-10-CM

## 2014-05-24 DIAGNOSIS — R1011 Right upper quadrant pain: Principal | ICD-10-CM

## 2014-06-08 ENCOUNTER — Ambulatory Visit: Payer: Self-pay | Admitting: General Surgery

## 2014-06-08 DIAGNOSIS — K8010 Calculus of gallbladder with chronic cholecystitis without obstruction: Secondary | ICD-10-CM

## 2014-06-08 HISTORY — PX: CHOLECYSTECTOMY: SHX55

## 2014-06-11 ENCOUNTER — Encounter: Payer: Self-pay | Admitting: General Surgery

## 2014-06-12 ENCOUNTER — Encounter: Payer: Self-pay | Admitting: General Surgery

## 2014-06-12 LAB — PATHOLOGY REPORT

## 2014-06-19 ENCOUNTER — Ambulatory Visit (INDEPENDENT_AMBULATORY_CARE_PROVIDER_SITE_OTHER): Payer: Self-pay | Admitting: General Surgery

## 2014-06-19 ENCOUNTER — Encounter: Payer: Self-pay | Admitting: General Surgery

## 2014-06-19 VITALS — BP 124/76 | HR 74 | Resp 12 | Ht 63.0 in | Wt 179.0 lb

## 2014-06-19 DIAGNOSIS — K811 Chronic cholecystitis: Secondary | ICD-10-CM

## 2014-06-19 NOTE — Progress Notes (Signed)
Patient ID: Tasha Woods, female   DOB: 1970/09/18, 43 y.o.   MRN: [id number]  Chief Complaint  Patient presents with  . Routine Post Op    gallbladder    HPI Tasha Woods is a 43 y.o. female here today for her post op gallbladder surgery done 06/08/14. Patient states she is doing well.  HPI  Past Medical History  Diagnosis Date  . Anxiety   . GERD (gastroesophageal reflux disease)   . Allergy     Past Surgical History  Procedure Laterality Date  . Inner ear surgery      age 51  . Colonoscopy  04-30-14    Dr Vira Agar  . Upper gi endoscopy  04-30-14    Dr Vira Agar  . Cholecystectomy  06/08/14    Family History  Problem Relation Age of Onset  . Hypertension Father   . Breast cancer Neg Hx   . Colon cancer Neg Hx   . Colon polyps Mother     Social History History  Substance Use Topics  . Smoking status: Never Smoker   . Smokeless tobacco: Never Used  . Alcohol Use: Yes     Comment: occasionally    Allergies  Allergen Reactions  . Ceftin [Cefuroxime Axetil] Rash  . Erythromycin Rash  . Penicillins Rash  . Sulfa Antibiotics Rash    Current Outpatient Prescriptions  Medication Sig Dispense Refill  . ALPRAZolam (XANAX) 0.25 MG tablet Take 1 tablet (0.25 mg total) by mouth daily as needed.  20 tablet  0  . escitalopram (LEXAPRO) 20 MG tablet Take 0.5 tablets (10 mg total) by mouth daily.  45 tablet  3  . esomeprazole (NEXIUM) 40 MG capsule Take 40 mg by mouth daily at 12 noon.      . fluticasone (FLONASE) 50 MCG/ACT nasal spray Use 2 sprays in each  nostril daily  48 g  0  . HYDROcodone-acetaminophen (NORCO/VICODIN) 5-325 MG per tablet Take 1 tablet by mouth every 6 (six) hours as needed for moderate pain.      Marland Kitchen ondansetron (ZOFRAN) 4 MG tablet Take 4 mg by mouth every 8 (eight) hours as needed for nausea or vomiting.      . traMADol (ULTRAM) 50 MG tablet Take 50 mg by mouth every 6 (six) hours as needed.       No current facility-administered medications for this  visit.    Review of Systems Review of Systems  Constitutional: Negative.   Respiratory: Negative.   Cardiovascular: Negative.     Blood pressure 124/76, pulse 74, resp. rate 12, height 5' 3"  (1.6 m), weight 179 lb (81.194 kg).  Physical Exam Physical Exam  Constitutional: She is oriented to person, place, and time. She appears well-developed and well-nourished.  Cardiovascular: Normal rate, regular rhythm and normal heart sounds.   Pulmonary/Chest: Effort normal and breath sounds normal.  Abdominal: Soft. Normal appearance.  Port site looks clean and healing well.   Neurological: She is alert and oriented to person, place, and time.  Skin: Skin is warm and dry.    Data Reviewed Mild chronic cholecystitis and cholesterolosis.   Assessment    The patient has had resolution of the right upper quadrant pain noted prior to surgery.     Plan     She will increase her activity as tolerated. Patient to return as needed.     PCP: Nash Shearer 06/19/2014, 9:51 PM

## 2014-06-19 NOTE — Patient Instructions (Signed)
Patient to return as needed.  Patient to return to work on 06/25/14.

## 2014-06-26 ENCOUNTER — Telehealth: Payer: Self-pay | Admitting: *Deleted

## 2014-06-26 NOTE — Telephone Encounter (Signed)
I talked with the patient and she said she is "fine" now, back at work and no pain. Thanks for your care.

## 2014-06-26 NOTE — Telephone Encounter (Signed)
Message copied by Currie ParisHATCH, Preslee Regas M on Tue Jun 26, 2014  9:36 AM ------      Message from: MemphisBYRNETT, Merrily PewJEFFREY W      Created: Mon Jun 25, 2014  1:37 PM       Give the patient a call to see if her right upper quadrant pain that was present prior to cholecystectomy has stayed away. Thank you surgery was on October 9. ------

## 2014-07-02 ENCOUNTER — Encounter: Payer: Self-pay | Admitting: General Surgery

## 2014-07-20 ENCOUNTER — Encounter: Payer: Self-pay | Admitting: Internal Medicine

## 2014-08-21 ENCOUNTER — Encounter: Payer: Self-pay | Admitting: Internal Medicine

## 2014-08-21 ENCOUNTER — Ambulatory Visit (INDEPENDENT_AMBULATORY_CARE_PROVIDER_SITE_OTHER): Payer: 59 | Admitting: Internal Medicine

## 2014-08-21 VITALS — BP 100/70 | HR 73 | Temp 98.2°F | Ht 63.0 in | Wt 184.8 lb

## 2014-08-21 DIAGNOSIS — K2100 Gastro-esophageal reflux disease with esophagitis, without bleeding: Secondary | ICD-10-CM

## 2014-08-21 DIAGNOSIS — Z658 Other specified problems related to psychosocial circumstances: Secondary | ICD-10-CM

## 2014-08-21 DIAGNOSIS — F439 Reaction to severe stress, unspecified: Secondary | ICD-10-CM

## 2014-08-21 DIAGNOSIS — Z8371 Family history of colonic polyps: Secondary | ICD-10-CM

## 2014-08-21 DIAGNOSIS — E669 Obesity, unspecified: Secondary | ICD-10-CM

## 2014-08-21 DIAGNOSIS — K811 Chronic cholecystitis: Secondary | ICD-10-CM

## 2014-08-21 MED ORDER — FLUTICASONE PROPIONATE 50 MCG/ACT NA SUSP: 2.0000 | Freq: Every day | NASAL | Status: DC

## 2014-08-21 MED ORDER — ESCITALOPRAM OXALATE 20 MG PO TABS: 20.0000 mg | ORAL_TABLET | Freq: Every day | ORAL | Status: DC

## 2014-08-21 NOTE — Progress Notes (Signed)
Subjective:    Patient ID: Tasha Woods, female    DOB: 12/02/1970, 43 y.o.   MRN: [id number]  HPI 43 year old female with past history of previous documented Crohn's disease who comes in today for a scheduled follow up.  Reports increased stress.  She recently started taking 71m of lexapro.  Feels this works better for her.  Feels better.  Would like to remain on this dose.   She has xanax if needed.  Rarely uses. Breathing stable.  No chest pain or tightness.  No nausea or vomiting.  No bowel change.  No acid reflux.  She was having problems with RUQ pain, etc.  Is s/p cholecystectomy.  Feels better.  She is concerned about her weight.  Wants to lose weight.  Has cut out sweet tea.  Drinking more water.  We discussed monitoring carbs.  She may join [employer] Plans to join the [employer] with a friend.      Past Medical History  Diagnosis Date  . Anxiety   . GERD (gastroesophageal reflux disease)   . Allergy     Current Outpatient Prescriptions on File Prior to Visit  Medication Sig Dispense Refill  . ALPRAZolam (XANAX) 0.25 MG tablet Take 1 tablet (0.25 mg total) by mouth daily as needed. 20 tablet 0  . escitalopram (LEXAPRO) 20 MG tablet Take 0.5 tablets (10 mg total) by mouth daily. 45 tablet 3  . esomeprazole (NEXIUM) 40 MG capsule Take 40 mg by mouth daily at 12 noon.    . fluticasone (FLONASE) 50 MCG/ACT nasal spray Use 2 sprays in each  nostril daily 48 g 0  . HYDROcodone-acetaminophen (NORCO/VICODIN) 5-325 MG per tablet Take 1 tablet by mouth every 6 (six) hours as needed for moderate pain.    .Marland Kitchenondansetron (ZOFRAN) 4 MG tablet Take 4 mg by mouth every 8 (eight) hours as needed for nausea or vomiting.    . traMADol (ULTRAM) 50 MG tablet Take 50 mg by mouth every 6 (six) hours as needed.     No current facility-administered medications on file prior to visit.    Review of Systems Patient denies any headache, lightheadedness or dizziness.  No increased sinus or allergy symptoms.   No chest pain, tightness or palpitations.  No increased shortness of breath, cough or congestion.  No nausea or vomiting.  No acid reflux.  No abdominal pain or cramping now.  Better since had gallbladder removed.  No significant bowel change, such as diarrhea, constipation, BRBPR or melana.  No significant increased loose stool after gallbladder removed.  No urine change.  Increased stress as outlined.  Feels she is handling things relatively well.  Feels the changed dose of Lexapro is adequate.  She is off estrogen.  Discussed weight loss.  She wants diet pills.  We discussed plan to try to lose weight prior to starting any medication.       Objective:   Physical Exam  Filed Vitals:   08/21/14 0900  BP: 100/70  Pulse: 73  Temp: 98.2 F (36.8 C)   Blood pressure recheck:  [id number] 43year old female in no acute distress.   HEENT:  Nares- clear.  Oropharynx - without lesions. NECK:  Supple.  Nontender.  No audible bruit.  HEART:  Appears to be regular. LUNGS:  No crackles or wheezing audible.  Respirations even and unlabored.  RADIAL PULSE:  Equal bilaterally.  ABDOMEN:  Soft, nontender.  Bowel sounds present and normal.  No audible abdominal  bruit.  EXTREMITIES:  No increased edema present.  DP pulses palpable and equal bilaterally.              Assessment & Plan:  1. Obesity (BMI 30-39.9) Discussed diet, exercise and weight loss.  See above.  Will hold on diet pills.    2. Family history of colonic polyps Colonoscopy as outlined.  Currently doing well.    3. Reflux esophagitis Currently asymptomatic.  On nexium.    4. Chronic cholecystitis S/p cholecystectomy.  Doing well.  Feels better.    5. Stress Increased stress recently.  On lexapro.  She increased dose to 37m q day.  Feels better.  Continue this dose.  Follow.    7. GI.  Currently asymptomatic.  No family history of colon cancer.  Had been previously diagnosed with Crohn's. Just had recdent colonoscopy 04/30/14 -  ileum normal, internal hemorrhoids.  No evidence of Crohn's.      HEALTH MAINTENANCE.  Physical 02/19/14.  Mammogram 01/05/14- BiRADS I.  Colonoscopy 04/30/14 as outlined.     I spent 25 minutes with the patient and more than 50% of the time was spent in consultation regarding the above.

## 2014-08-21 NOTE — Progress Notes (Signed)
Pre visit review using our clinic review tool, if applicable. No additional management support is needed unless otherwise documented below in the visit note. 

## 2014-08-27 ENCOUNTER — Encounter: Payer: Self-pay | Admitting: Internal Medicine

## 2014-08-27 DIAGNOSIS — F439 Reaction to severe stress, unspecified: Secondary | ICD-10-CM | POA: Insufficient documentation

## 2014-08-27 DIAGNOSIS — Z6832 Body mass index (BMI) 32.0-32.9, adult: Secondary | ICD-10-CM | POA: Insufficient documentation

## 2014-10-13 ENCOUNTER — Other Ambulatory Visit: Payer: Self-pay | Admitting: Internal Medicine

## 2014-11-20 ENCOUNTER — Ambulatory Visit: Payer: 59 | Admitting: Internal Medicine

## 2014-11-29 ENCOUNTER — Encounter: Payer: Self-pay | Admitting: Internal Medicine

## 2014-11-29 LAB — LIPID PANEL
Cholesterol: 166 mg/dL (ref 0–200)
HDL: 45 mg/dL (ref 35–70)
LDL Cholesterol: 98 mg/dL
Triglycerides: 114 mg/dL (ref 40–160)

## 2014-11-30 ENCOUNTER — Ambulatory Visit: Payer: Self-pay | Admitting: Nurse Practitioner

## 2014-12-04 ENCOUNTER — Encounter: Payer: Self-pay | Admitting: Internal Medicine

## 2014-12-04 ENCOUNTER — Ambulatory Visit (INDEPENDENT_AMBULATORY_CARE_PROVIDER_SITE_OTHER): Payer: 59 | Admitting: Internal Medicine

## 2014-12-04 VITALS — BP 118/64 | HR 80 | Temp 98.0°F | Ht 63.0 in | Wt 186.2 lb

## 2014-12-04 DIAGNOSIS — K2100 Gastro-esophageal reflux disease with esophagitis, without bleeding: Secondary | ICD-10-CM

## 2014-12-04 DIAGNOSIS — Z1239 Encounter for other screening for malignant neoplasm of breast: Secondary | ICD-10-CM | POA: Diagnosis not present

## 2014-12-04 DIAGNOSIS — E669 Obesity, unspecified: Secondary | ICD-10-CM

## 2014-12-04 DIAGNOSIS — Z9109 Other allergy status, other than to drugs and biological substances: Secondary | ICD-10-CM

## 2014-12-04 DIAGNOSIS — Z8371 Family history of colonic polyps: Secondary | ICD-10-CM

## 2014-12-04 DIAGNOSIS — Z0000 Encounter for general adult medical examination without abnormal findings: Secondary | ICD-10-CM

## 2014-12-04 DIAGNOSIS — F439 Reaction to severe stress, unspecified: Secondary | ICD-10-CM

## 2014-12-04 DIAGNOSIS — Z91048 Other nonmedicinal substance allergy status: Secondary | ICD-10-CM

## 2014-12-04 DIAGNOSIS — Z658 Other specified problems related to psychosocial circumstances: Secondary | ICD-10-CM

## 2014-12-04 NOTE — Progress Notes (Signed)
Patient ID: Tasha Woods, female   DOB: 07/01/1971, 44 y.o.   MRN: [id number]   Subjective:    Patient ID: Tasha Woods, female    DOB: Feb 11, 1971, 44 y.o.   MRN: [id number]  HPI  Patient here for a scheduled follow up.  States she is doing relatively well.  Is concerned about not being able to lose weight.  Was wanting to start medication to help her lose weight.  We discussed diet and exercise.  She is not exercising.  Not adjusting her diet.  We discussed getting on and regular exercise regimen and diet adjustments.  No cardiac symptoms with increased activity or exertion.  Breathing stable.  Bowels stable.  Feels she is handling stress relatively well.  On lexapro.  Feels this dose is working for her.    Past Medical History  Diagnosis Date  . Anxiety   . GERD (gastroesophageal reflux disease)   . Allergy      Current Outpatient Prescriptions on File Prior to Visit  Medication Sig Dispense Refill  . ALPRAZolam (XANAX) 0.25 MG tablet Take 1 tablet (0.25 mg total) by mouth daily as needed. 20 tablet 0  . escitalopram (LEXAPRO) 20 MG tablet Take 1 tablet (20 mg total) by mouth daily. 90 tablet 1  . escitalopram (LEXAPRO) 20 MG tablet TAKE 1 TABLET (20 MG TOTAL) BY MOUTH DAILY. 30 tablet 0  . esomeprazole (NEXIUM) 40 MG capsule Take 40 mg by mouth daily at 12 noon.    . fluticasone (FLONASE) 50 MCG/ACT nasal spray Place 2 sprays into both nostrils daily. 48 g 1  . HYDROcodone-acetaminophen (NORCO/VICODIN) 5-325 MG per tablet Take 1 tablet by mouth every 6 (six) hours as needed for moderate pain.    Marland Kitchen ondansetron (ZOFRAN) 4 MG tablet Take 4 mg by mouth every 8 (eight) hours as needed for nausea or vomiting.    . traMADol (ULTRAM) 50 MG tablet Take 50 mg by mouth every 6 (six) hours as needed.     No current facility-administered medications on file prior to visit.    Review of Systems  Constitutional: Negative for appetite change.       She is concerned about her weight and not being  able to lose weight.  Not exercising.  Not watching her diet.    HENT: Negative for congestion and sinus pressure.   Respiratory: Negative for cough, chest tightness and shortness of breath.   Cardiovascular: Negative for chest pain, palpitations and leg swelling.  Gastrointestinal: Negative for nausea, vomiting, abdominal pain and diarrhea.  Neurological: Negative for dizziness, light-headedness and headaches.  Psychiatric/Behavioral:       Handling stress well.  On lexapro.        Objective:    Physical Exam  Constitutional: She appears well-developed and well-nourished. No distress.  HENT:  Nose: Nose normal.  Mouth/Throat: Oropharynx is clear and moist.  Neck: Neck supple. No thyromegaly present.  Cardiovascular: Normal rate and regular rhythm.   Pulmonary/Chest: Breath sounds normal. No respiratory distress. She has no wheezes.  Abdominal: Soft. Bowel sounds are normal. There is no tenderness.  Musculoskeletal: She exhibits no edema or tenderness.  Lymphadenopathy:    She has no cervical adenopathy.  Skin: No rash noted. No erythema.  Psychiatric: She has a normal mood and affect. Her behavior is normal.    BP 118/64 mmHg  Pulse 80  Temp(Src) 98 F (36.7 C) (Oral)  Ht 5' 3"  (1.6 m)  Wt 186 lb 4 oz (84.482 kg)  BMI 33.00 kg/m2  SpO2 97% Wt Readings from Last 3 Encounters:  12/04/14 186 lb 4 oz (84.482 kg)  08/21/14 184 lb 12 oz (83.802 kg)  06/19/14 179 lb (81.194 kg)     Lab Results  Component Value Date   WBC 8.1 04/11/2014   HGB 14.1 04/11/2014   HCT 43.4 04/11/2014   PLT 226 02/28/2014   GLUCOSE 89 04/11/2014   CHOL 166 11/29/2014   TRIG 114 11/29/2014   HDL 45 11/29/2014   LDLCALC 98 11/29/2014   ALT 26 04/11/2014   AST 21 04/11/2014   NA 139 04/11/2014   K 4.6 04/11/2014   CL 101 04/11/2014   CREATININE 0.74 04/11/2014   BUN 14 04/11/2014   CO2 21 04/11/2014   TSH 3.76 02/28/2014       Assessment & Plan:   Problem List Items Addressed  This Visit    Environmental allergies    Using flonase.  Stable.        Family history of colonic polyps    Colonoscopy 04/30/14 - internal hemorrhoids.  Recommended f/u colonoscopy 04/2019.        Health care maintenance    Physical 02/19/14.  Mammogram 01/05/14 - Birads I.  Cholesterol 11/28/14 - wnl.  LDL 98.        Obesity (BMI 30-39.9)    Discussed diet and exercise.  Discussed my desire not to start medication at this time.  I want her to get in a regular exercise routine and adjust diet first.  Will see if still needs.  F/u soon regarding her weight.       Reflux esophagitis    EGD 04/30/14 as outlined.  On nexium.  Follow. Symptoms controlled.        Stress    Doing well on lexapro.  Follow.         Other Visit Diagnoses    Breast cancer screening    -  Primary    Relevant Orders    MM DIGITAL SCREENING BILATERAL        Tasha Pheasant, MD

## 2014-12-04 NOTE — Progress Notes (Signed)
Pre visit review using our clinic review tool, if applicable. No additional management support is needed unless otherwise documented below in the visit note. 

## 2014-12-05 ENCOUNTER — Telehealth: Payer: Self-pay | Admitting: Internal Medicine

## 2014-12-05 NOTE — Telephone Encounter (Signed)
Sent message to clarify which pharmacy to send in rx.

## 2014-12-09 ENCOUNTER — Encounter: Payer: Self-pay | Admitting: Internal Medicine

## 2014-12-09 DIAGNOSIS — Z0000 Encounter for general adult medical examination without abnormal findings: Secondary | ICD-10-CM | POA: Insufficient documentation

## 2014-12-09 NOTE — Assessment & Plan Note (Signed)
Physical 02/19/14.  Mammogram 01/05/14 - Birads I.  Cholesterol 11/28/14 - wnl.  LDL 98.

## 2014-12-09 NOTE — Assessment & Plan Note (Signed)
Doing well on lexapro.  Follow.

## 2014-12-09 NOTE — Assessment & Plan Note (Signed)
Using flonase.  Stable.

## 2014-12-09 NOTE — Assessment & Plan Note (Signed)
EGD 04/30/14 as outlined.  On nexium.  Follow. Symptoms controlled.

## 2014-12-09 NOTE — Assessment & Plan Note (Signed)
Colonoscopy 04/30/14 - internal hemorrhoids.  Recommended f/u colonoscopy 04/2019.

## 2014-12-09 NOTE — Assessment & Plan Note (Signed)
Discussed diet and exercise.  Discussed my desire not to start medication at this time.  I want her to get in a regular exercise routine and adjust diet first.  Will see if still needs.  F/u soon regarding her weight.

## 2014-12-11 MED ORDER — FLUTICASONE PROPIONATE 50 MCG/ACT NA SUSP: 2.0000 | Freq: Every day | NASAL | Status: DC

## 2014-12-11 MED ORDER — ESCITALOPRAM OXALATE 20 MG PO TABS: 20.0000 mg | ORAL_TABLET | Freq: Every day | ORAL | Status: DC

## 2014-12-11 NOTE — Telephone Encounter (Signed)
rx for lexapro #90 with one refill and flonase 3 months with one refill sent [facility] to [facility].

## 2014-12-20 IMAGING — NM NUCLEAR MEDICINE HEPATOHBILIARY INCLUDE GB
2 series · 12 of 12 positions shown · non-contrast
Comparison: Ultrasound 04/17/2014 and CT 04/23/2014 as well as
previous HIDA scan 12/23/2010

CLINICAL DATA: Right upper quadrant pain and nausea 7 weeks.

EXAM:
NUCLEAR MEDICINE HEPATOBILIARY IMAGING WITH GALLBLADDER EF
TECHNIQUE: Sequential images of the abdomen were obtained [DATE] minutes
following intravenous administration of radiopharmaceutical. After
slow intravenous infusion of 1.66 micrograms Cholecystokinin,
gallbladder ejection fraction was determined.
RADIOPHARMACEUTICALS:  8.75 Millicurie Qc-WWm Choletec

[Series 1000: gallbladder dynamic · 4.80mm/px · 6 of 120 frames shown]
[frame 11/120]
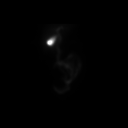
[frame 31/120]
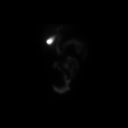
[frame 51/120]
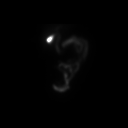
[frame 71/120]
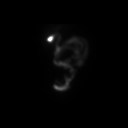
[frame 91/120]
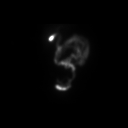
[frame 111/120]
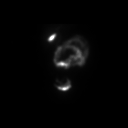

[Series 1000: gallbladder dynamic (results) · 4.80mm/px · 6 of 120 frames shown]
[frame 11/120]
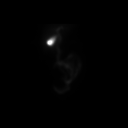
[frame 31/120]
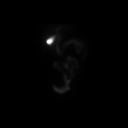
[frame 51/120]
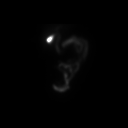
[frame 71/120]
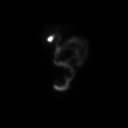
[frame 91/120]
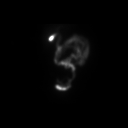
[frame 111/120]
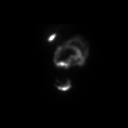

[12 of 12 positions shown; findings below may reference images not displayed]

FINDINGS: Exam demonstrates normal uniform uptake of radiotracer by the liver.
Biliary to bowel transit is present at 15-20 min with gallbladder
filling at 40 min. After CCK injection, the gallbladder ejection
fraction was calculated to be 72%.

The patient did experience symptoms during CCK infusion described
and [DATE] pain.
IMPRESSION: Normal hepatobiliary scan without evidence of acute cholecystitis.
Normal gallbladder ejection fraction of 72%, although patient does
describe pain during CCK infusion.

## 2014-12-22 NOTE — Op Note (Signed)
PATIENT NAME:  Tasha Woods, Tasha Woods MR#:  [id number] DATE OF BIRTH:  29-Apr-1971  DATE OF PROCEDURE:  06/08/2014  PREOPERATIVE DIAGNOSIS: Chronic cholecystitis.   POSTOPERATIVE DIAGNOSIS: Chronic cholecystitis.   OPERATIVE PROCEDURE:  Laparoscopic cholecystectomy with intraoperative cholangiograms.   SURGEON: Hervey Ard, MD.   ANESTHESIA: General endotracheal, under Dr. Elyse Hsu.   ESTIMATED BLOOD LOSS: 5 mL.   CLINICAL NOTE: This 44 year old woman has had a 2-55-monthhistory of recurrent right upper quadrant pain. Ultrasound was negative. Upper and lower endoscopy was negative. HIDA scan showed increased ejection fraction with reproduction of her pain during CCK infusion. Given her options for management we have elected to proceed to cholecystectomy in the idea that this will relieve her pain. Both prior to presenting to surgery and the morning of surgery the patient and her family were informed that there is no guarantee that pain relief can be obtained.   OPERATIVE NOTE: With the patient under general endotracheal anesthesia, the abdomen was prepped with ChloraPrep and draped. In Trendelenburg position, a Veress needle was placed through a transumbilical incision. After assuring intra-abdominal location with the hanging drop test, the abdomen was insufflated with CO2 at 10 mmHg pressure. A 10-mm step port was expanded and inspection showed no evidence of injury from initial port placement. Inspection of the anterior abdominal wall was unremarkable. There were adhesions of the omentum to the undersurface of the gallbladder. These were mild. The gallbladder had lost its normal robin's egg blue color. No gallbladder wall thickening was appreciated. The visualized portion of the liver and stomach were unremarkable.   The gallbladder was placed on cephalad traction after placement of an 11-mm Xcel port in the epigastrium and two 5-mm step ports placed laterally. The neck of the gallbladder was placed on  tension and an overlying vessel at the cystic duct level doubly clipped and divided. The cystic duct was cleared with a right angle clamp and a Kumar catheter was used for fluoroscopic cholangiograms. Six mL of 1/2-strength Conray was utilized. Prompt filling of the right and left hepatic ducts and free flow from the common duct into the duodenum was appreciated. Normal ductal anatomy noted.   The cystic duct was doubly clipped and divided. The posterior branch of the cystic artery was treated in a similar fashion. The gallbladder was removed from the liver bed making use of hook cautery dissection. It was then delivered through the umbilical port site. Inspection from the epigastric site showed no evidence of injury from initial port placement. The right upper quadrant was irrigated with lactated Ringers solution. Good hemostasis was appreciated. The clips were visualized and were appropriately placed. The abdomen was then desufflated and ports removed under direct vision.   Skin incisions were closed with 4-0 Vicryl subcuticular sutures. Benzoin, Steri-Strips, Telfa and Tegaderm dressing were then applied.   The patient tolerated the procedure well and was taken to the recovery room in stable condition.   ____________________________ JRobert Bellow MD jwb:lt D: 06/08/2014 20:12:38 ET T: 06/08/2014 20:37:40 ET JOB#: [id number] cc: JRobert Bellow MD, <Dictator> RManya Silvas MD CEinar Pheasant MD  JEFFREY WAmedeo KinsmanMD ELECTRONICALLY SIGNED 06/12/2014 9:54

## 2015-01-07 ENCOUNTER — Ambulatory Visit
Admission: RE | Admit: 2015-01-07 | Discharge: 2015-01-07 | Disposition: A | Discharge status: Home / Self Care | Payer: 59 | Source: Ambulatory Visit | Attending: Internal Medicine | Admitting: Internal Medicine

## 2015-01-07 DIAGNOSIS — Z1239 Encounter for other screening for malignant neoplasm of breast: Secondary | ICD-10-CM

## 2015-01-07 DIAGNOSIS — Z1231 Encounter for screening mammogram for malignant neoplasm of breast: Secondary | ICD-10-CM | POA: Insufficient documentation

## 2015-03-07 ENCOUNTER — Ambulatory Visit (INDEPENDENT_AMBULATORY_CARE_PROVIDER_SITE_OTHER): Payer: 59 | Admitting: Internal Medicine

## 2015-03-07 ENCOUNTER — Encounter: Payer: Self-pay | Admitting: Internal Medicine

## 2015-03-07 VITALS — BP 110/70 | HR 72 | Temp 98.3°F | Ht 63.0 in | Wt 182.1 lb

## 2015-03-07 DIAGNOSIS — Z0000 Encounter for general adult medical examination without abnormal findings: Secondary | ICD-10-CM

## 2015-03-07 DIAGNOSIS — Z8371 Family history of colonic polyps: Secondary | ICD-10-CM

## 2015-03-07 DIAGNOSIS — K2100 Gastro-esophageal reflux disease with esophagitis, without bleeding: Secondary | ICD-10-CM

## 2015-03-07 DIAGNOSIS — Z9109 Other allergy status, other than to drugs and biological substances: Secondary | ICD-10-CM

## 2015-03-07 DIAGNOSIS — E669 Obesity, unspecified: Secondary | ICD-10-CM

## 2015-03-07 DIAGNOSIS — F439 Reaction to severe stress, unspecified: Secondary | ICD-10-CM

## 2015-03-07 DIAGNOSIS — Z124 Encounter for screening for malignant neoplasm of cervix: Secondary | ICD-10-CM

## 2015-03-07 LAB — HM PAP SMEAR: HM Pap smear: NEGATIVE

## 2015-03-07 NOTE — Progress Notes (Signed)
Pre visit review using our clinic review tool, if applicable. No additional management support is needed unless otherwise documented below in the visit note. 

## 2015-03-07 NOTE — Progress Notes (Signed)
Patient ID: Tasha Woods, female   DOB: 02/27/1971, 44 y.o.   MRN: [id number]   Subjective:    Patient ID: Tasha Woods, female    DOB: December 14, 1970, 44 y.o.   MRN: [id number]  HPI  Patient here to follow up on her current medical issues as well as for her complete physical exam.  She is trying to watch her diet.  Has made some changes.  Cut out soft drinks.  Is walking.  Discussed diet and exercise.  No cardiac symptoms with increased activity or exertion.  No sob.  No upper gi symptoms reported.  Bowels stable.     Past Medical History  Diagnosis Date  . Anxiety   . GERD (gastroesophageal reflux disease)   . Allergy     Current Outpatient Prescriptions on File Prior to Visit  Medication Sig Dispense Refill  . ALPRAZolam (XANAX) 0.25 MG tablet Take 1 tablet (0.25 mg total) by mouth daily as needed. 20 tablet 0  . escitalopram (LEXAPRO) 20 MG tablet Take 1 tablet (20 mg total) by mouth daily. 90 tablet 1  . esomeprazole (NEXIUM) 40 MG capsule Take 40 mg by mouth daily at 12 noon.    . fluticasone (FLONASE) 50 MCG/ACT nasal spray Place 2 sprays into both nostrils daily. 48 g 1  . HYDROcodone-acetaminophen (NORCO/VICODIN) 5-325 MG per tablet Take 1 tablet by mouth every 6 (six) hours as needed for moderate pain.    Marland Kitchen ondansetron (ZOFRAN) 4 MG tablet Take 4 mg by mouth every 8 (eight) hours as needed for nausea or vomiting.    . traMADol (ULTRAM) 50 MG tablet Take 50 mg by mouth every 6 (six) hours as needed.     No current facility-administered medications on file prior to visit.    Review of Systems  Constitutional: Negative for appetite change and unexpected weight change.       Has lost some weight.  Has adjusted her diet.  Is walking.   HENT: Negative for congestion and sinus pressure.   Eyes: Negative for pain and visual disturbance.  Respiratory: Negative for cough, chest tightness and shortness of breath.   Cardiovascular: Negative for chest pain, palpitations and leg swelling.   Gastrointestinal: Negative for nausea, vomiting, abdominal pain and diarrhea.  Genitourinary: Negative for dysuria and difficulty urinating.  Musculoskeletal: Negative for back pain and joint swelling.  Skin: Negative for color change and rash.  Neurological: Negative for dizziness, light-headedness and headaches.  Hematological: Negative for adenopathy. Does not bruise/bleed easily.  Psychiatric/Behavioral: Negative for dysphoric mood and agitation.       Objective:    Physical Exam  Constitutional: She is oriented to person, place, and time. She appears well-developed and well-nourished.  HENT:  Nose: Nose normal.  Mouth/Throat: Oropharynx is clear and moist.  Eyes: Right eye exhibits no discharge. Left eye exhibits no discharge. No scleral icterus.  Neck: Neck supple. No thyromegaly present.  Cardiovascular: Normal rate and regular rhythm.   Pulmonary/Chest: Breath sounds normal. No accessory muscle usage. No tachypnea. No respiratory distress. She has no decreased breath sounds. She has no wheezes. She has no rhonchi. Right breast exhibits no inverted nipple, no mass, no nipple discharge and no tenderness (no axillary adenopathy). Left breast exhibits no inverted nipple, no mass, no nipple discharge and no tenderness (no axilarry adenopathy).  Abdominal: Soft. Bowel sounds are normal. There is no tenderness.  Genitourinary:  Normal external genitalia.  Vaginal vault without lesions.  Cervix identified.  Pap smear performed.  Could not appreciate any adnexal masses or tenderness.    Musculoskeletal: She exhibits no edema or tenderness.  Lymphadenopathy:    She has no cervical adenopathy.  Neurological: She is alert and oriented to person, place, and time.  Skin: Skin is warm. No rash noted.  Psychiatric: She has a normal mood and affect. Her behavior is normal.    BP 110/70 mmHg  Pulse 72  Temp(Src) 98.3 F (36.8 C) (Oral)  Ht 5' 3"  (1.6 m)  Wt 182 lb 2 oz (82.611 kg)  BMI  32.27 kg/m2  SpO2 97% Wt Readings from Last 3 Encounters:  03/07/15 182 lb 2 oz (82.611 kg)  12/04/14 186 lb 4 oz (84.482 kg)  08/21/14 184 lb 12 oz (83.802 kg)     Lab Results  Component Value Date   WBC 6.5 04/23/2014   HGB 14.0 04/23/2014   HCT 41.9 04/23/2014   PLT 250 04/23/2014   GLUCOSE 88 04/23/2014   CHOL 166 11/29/2014   TRIG 114 11/29/2014   HDL 45 11/29/2014   LDLCALC 98 11/29/2014   ALT 48 04/23/2014   AST 44* 04/23/2014   NA 141 04/23/2014   K 4.4 04/23/2014   CL 108* 04/23/2014   CREATININE 0.80 04/23/2014   BUN 11 04/23/2014   CO2 24 04/23/2014   TSH 3.76 02/28/2014    Mm Digital Screening Bilateral  01/07/2015   CLINICAL DATA:  Screening.  EXAM: DIGITAL SCREENING BILATERAL MAMMOGRAM WITH CAD  COMPARISON:  Previous exam(s).  ACR Breast Density Category b: There are scattered areas of fibroglandular density.  FINDINGS: There are no findings suspicious for malignancy. Images were processed with CAD.  IMPRESSION: No mammographic evidence of malignancy. A result letter of this screening mammogram will be mailed directly to the patient.  RECOMMENDATION: Screening mammogram in one year. (Code:SM-B-01Y)  BI-RADS CATEGORY  1: Negative.   Electronically Signed   By: Marin Olp M.D.   On: 01/07/2015 16:56       Assessment & Plan:   Problem List Items Addressed This Visit    Environmental allergies    Uses flonase.        Family history of colonic polyps    Colonoscopy 04/30/14.  Recommended f/u colonoscopy 04/2019.        Health care maintenance    Physical today 03/07/15.  Mammogram 01/07/15 - Birads I.  Colonoscopy 04/30/14.  Recommended f/u colonoscopy in 04/2019.  Cholesterol 11/28/14 - normal.  LDL 98.       Obesity (BMI 30-39.9)    Diet and exercise.  Has lost some weight.       Reflux esophagitis    EGD as outlined 8//31/15 (in overview).  On nexium.  No upper symptoms reported.       Stress    On lexapro.  Does not feel she needs any further  intervention at this time.  Follow.         Other Visit Diagnoses    Routine general medical examination at a health care facility    -  Primary    Pap smear for cervical cancer screening        Relevant Orders    Cytology - PAP        Einar Pheasant, MD

## 2015-03-10 ENCOUNTER — Encounter: Payer: Self-pay | Admitting: Internal Medicine

## 2015-03-10 NOTE — Assessment & Plan Note (Signed)
Diet and exercise.  Has lost some weight.

## 2015-03-10 NOTE — Assessment & Plan Note (Signed)
On lexapro.  Does not feel she needs any further intervention at this time.  Follow.

## 2015-03-10 NOTE — Assessment & Plan Note (Signed)
Colonoscopy 04/30/14.  Recommended f/u colonoscopy 04/2019.

## 2015-03-10 NOTE — Assessment & Plan Note (Signed)
Physical today 03/07/15.  Mammogram 01/07/15 - Birads I.  Colonoscopy 04/30/14.  Recommended f/u colonoscopy in 04/2019.  Cholesterol 11/28/14 - normal.  LDL 98.

## 2015-03-10 NOTE — Assessment & Plan Note (Signed)
Uses flonase.

## 2015-03-10 NOTE — Assessment & Plan Note (Signed)
EGD as outlined 8//31/15 (in overview).  On nexium.  No upper symptoms reported.

## 2015-03-15 ENCOUNTER — Encounter: Payer: Self-pay | Admitting: Internal Medicine

## 2015-03-15 ENCOUNTER — Telehealth: Payer: Self-pay | Admitting: *Deleted

## 2015-03-15 NOTE — Telephone Encounter (Signed)
Sent mychart message with pap results

## 2015-03-19 NOTE — Telephone Encounter (Signed)
Unread mychart message mailed to patient 

## 2015-05-08 LAB — LIPID PANEL
Cholesterol: 174 mg/dL (ref 0–200)
HDL: 43 mg/dL (ref 35–70)
LDL Cholesterol: 104 mg/dL
Triglycerides: 137 mg/dL (ref 40–160)

## 2015-05-08 LAB — CBC AND DIFFERENTIAL
HCT: 38 % (ref 36–46)
Hemoglobin: 13.2 g/dL (ref 12.0–16.0)
Neutrophils Absolute: 54 /uL
Platelets: 224 10*3/uL (ref 150–399)
WBC: 5.9 10^3/mL

## 2015-05-08 LAB — HEPATIC FUNCTION PANEL
ALT: 42 U/L — AB (ref 7–35)
AST: 31 U/L (ref 13–35)
Alkaline Phosphatase: 101 U/L (ref 25–125)
Bilirubin, Total: 0.5 mg/dL

## 2015-05-08 LAB — BASIC METABOLIC PANEL
BUN: 14 mg/dL (ref 4–21)
Creatinine: 0.9 mg/dL (ref 0.5–1.1)
Potassium: 4.6 mmol/L (ref 3.4–5.3)
Sodium: 141 mmol/L (ref 137–147)

## 2015-05-08 LAB — TSH: TSH: 3.57 u[IU]/mL (ref 0.41–5.90)

## 2015-05-15 ENCOUNTER — Telehealth: Payer: Self-pay | Admitting: Internal Medicine

## 2015-05-15 NOTE — Telephone Encounter (Signed)
Pt notified of lab results via my chart.  To let me know about f/u lab form.

## 2015-05-16 ENCOUNTER — Encounter: Payer: Self-pay | Admitting: Internal Medicine

## 2015-05-16 NOTE — Telephone Encounter (Signed)
Order form completed and placed in your box.  Thanks.    Dr Nicki Reaper

## 2015-05-16 NOTE — Telephone Encounter (Signed)
Form mailed to patient.

## 2015-05-16 NOTE — Telephone Encounter (Signed)
See her my chart message.  I need to see her to document discussion about her weight, etc and can get the form completed then. I can see her Monday 05/20/15 at 10:00.  Block 30 min

## 2015-05-27 ENCOUNTER — Encounter: Payer: Self-pay | Admitting: Internal Medicine

## 2015-05-28 NOTE — Telephone Encounter (Signed)
Please advise 

## 2015-05-29 NOTE — Telephone Encounter (Signed)
Pt notified that form is ready & needs to be picked up. Form placed up front

## 2015-06-18 ENCOUNTER — Other Ambulatory Visit: Payer: Self-pay | Admitting: Internal Medicine

## 2015-08-19 IMAGING — MG MM DIGITAL SCREENING BILAT W/ CAD
4 series · 4 of 4 positions shown · non-contrast
Comparison: Previous exam(s).

CLINICAL DATA: Screening.

EXAM:
DIGITAL SCREENING BILATERAL MAMMOGRAM WITH CAD

[L CC]
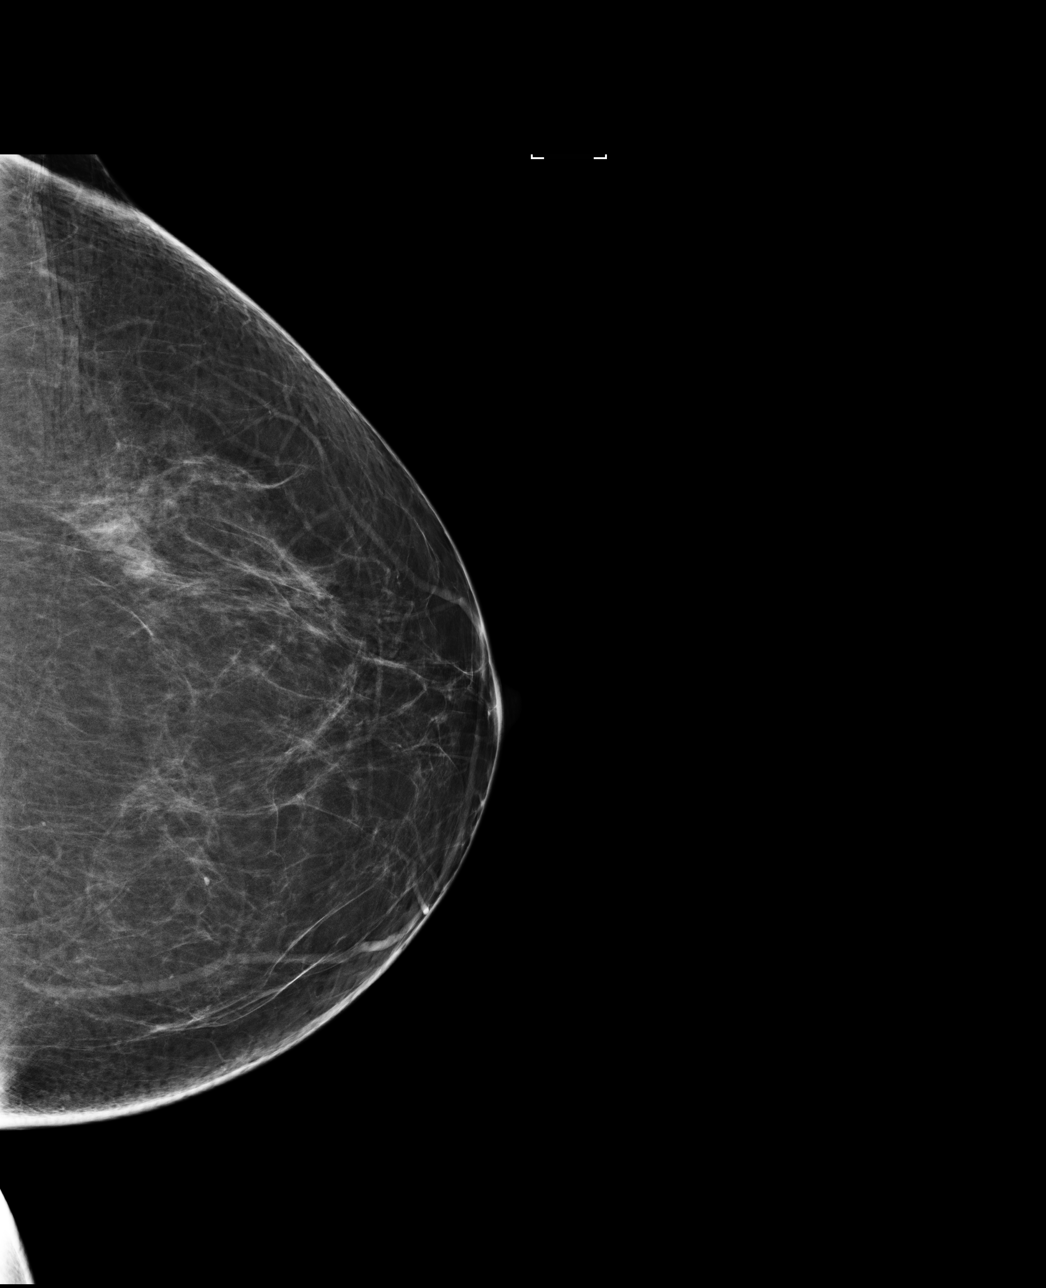

[R MLO]
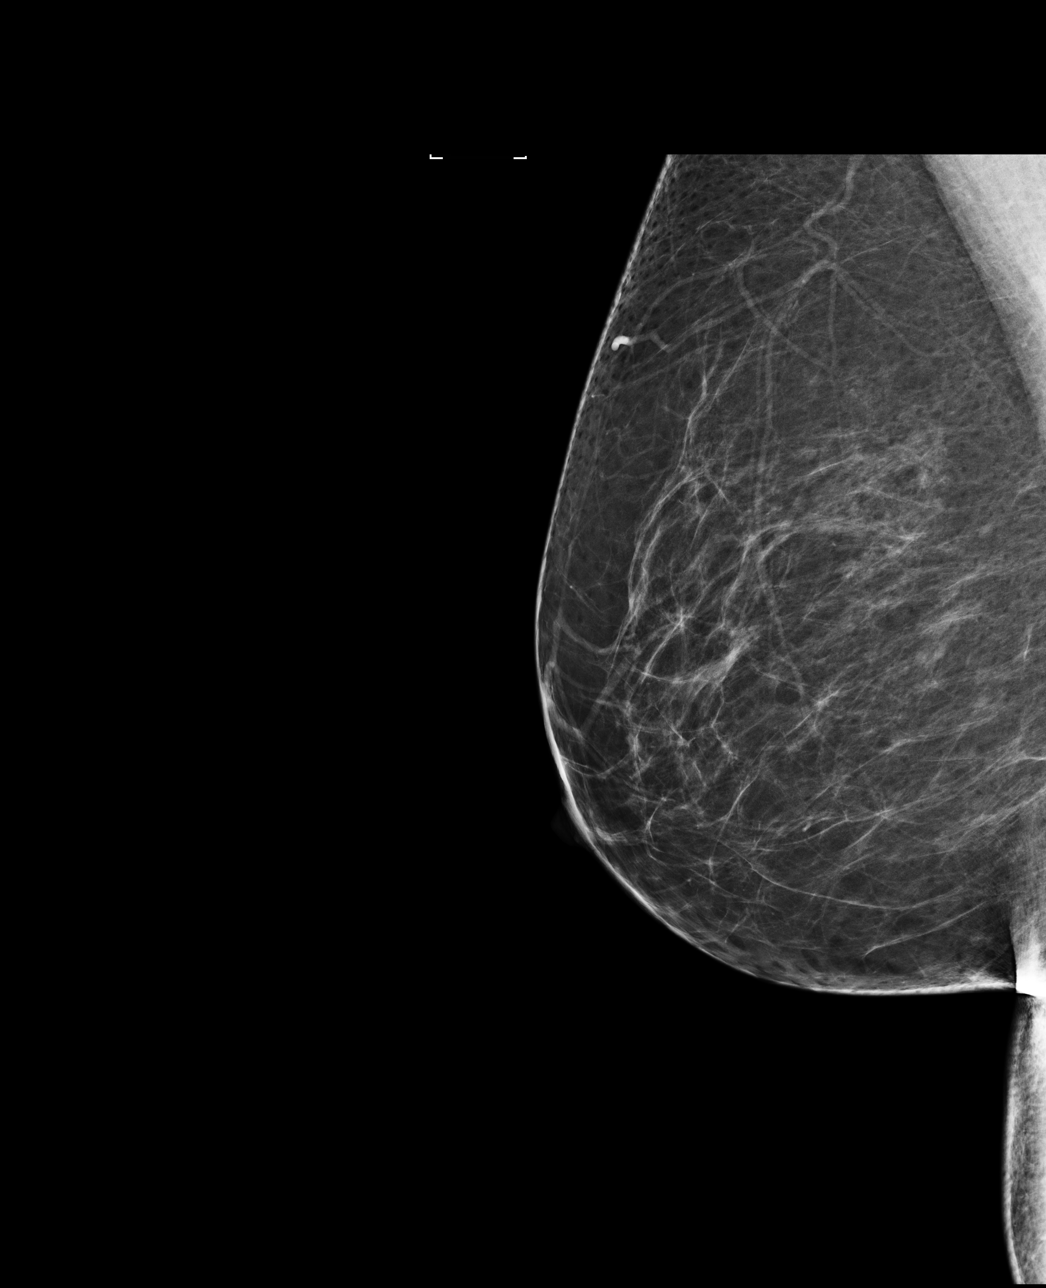

[L MLO]
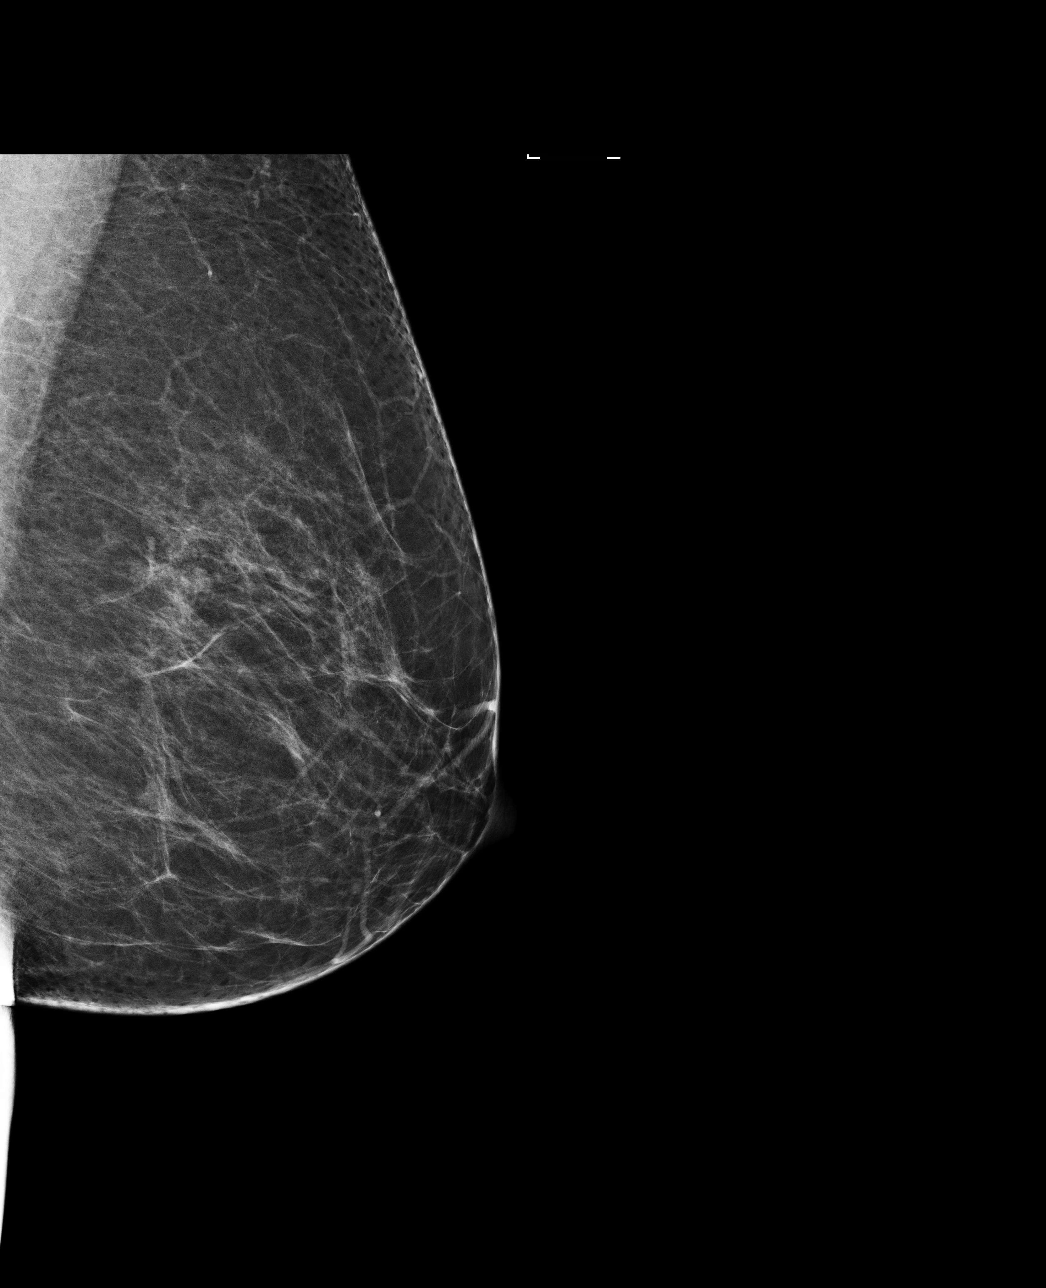

[R CC]
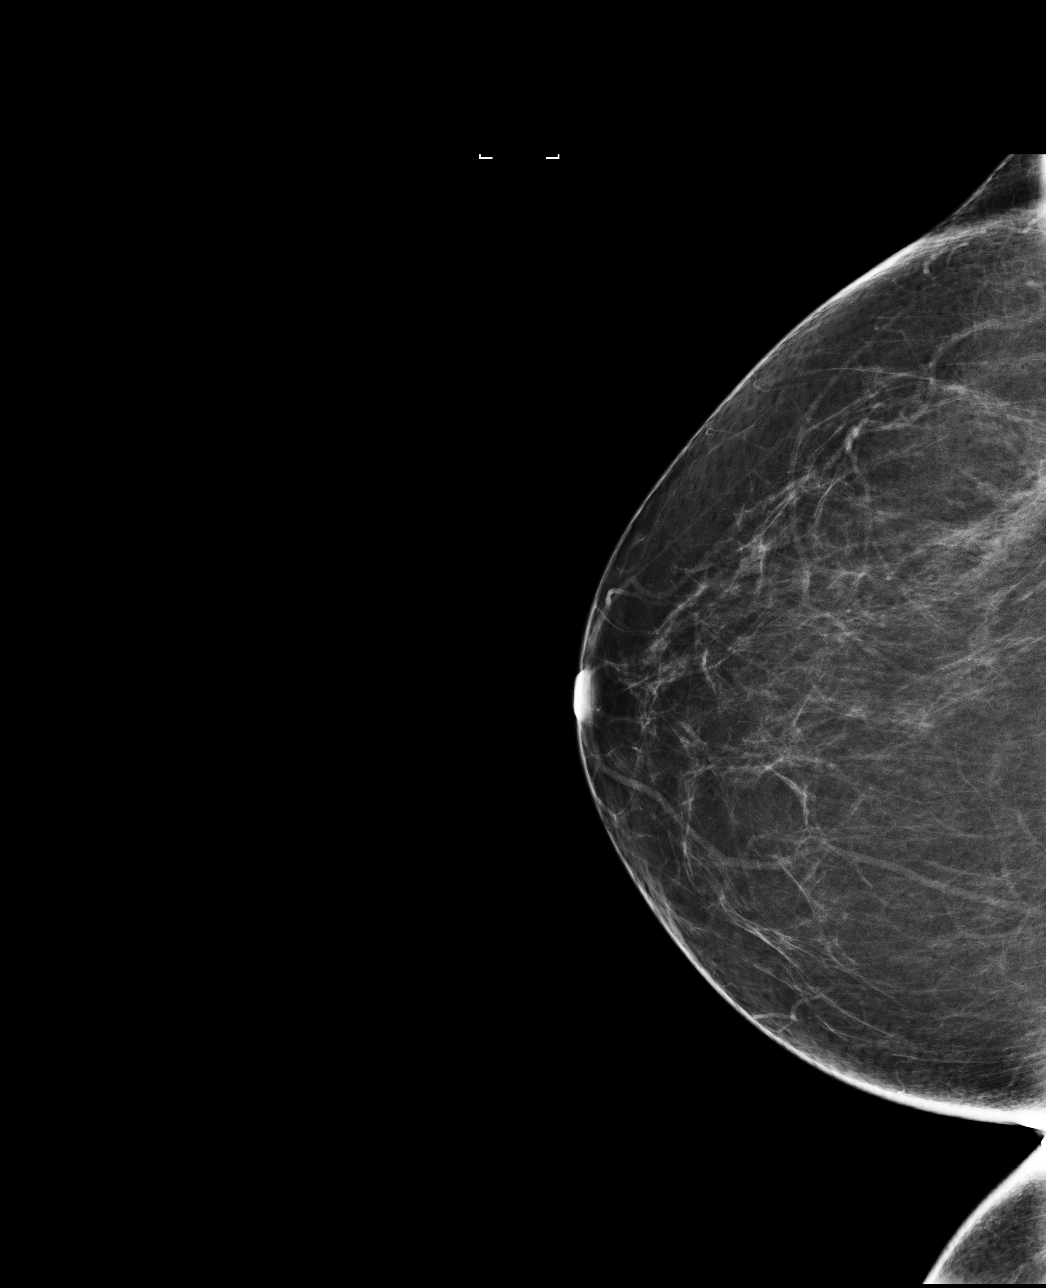

[4 of 4 positions shown; findings below may reference images not displayed]

ACR Breast Density Category b: There are scattered areas of
fibroglandular density.
FINDINGS: There are no findings suspicious for malignancy. Images were
processed with CAD.
IMPRESSION: No mammographic evidence of malignancy. A result letter of this
screening mammogram will be mailed directly to the patient.

RECOMMENDATION:
Screening mammogram in one year. (Code:AS-G-LCT)

BI-RADS CATEGORY  1: Negative.

## 2015-09-16 ENCOUNTER — Encounter: Payer: Self-pay | Admitting: Nurse Practitioner

## 2015-09-16 ENCOUNTER — Ambulatory Visit (INDEPENDENT_AMBULATORY_CARE_PROVIDER_SITE_OTHER): Payer: 59 | Admitting: Nurse Practitioner

## 2015-09-16 VITALS — BP 102/62 | HR 76 | Temp 98.1°F | Resp 14 | Ht 63.0 in | Wt 185.8 lb

## 2015-09-16 DIAGNOSIS — J0100 Acute maxillary sinusitis, unspecified: Secondary | ICD-10-CM

## 2015-09-16 MED ORDER — HYDROCOD POLST-CPM POLST ER 10-8 MG/5ML PO SUER: 5.0000 mL | Freq: Every evening | ORAL | Status: DC | PRN

## 2015-09-16 MED ORDER — DOXYCYCLINE HYCLATE 100 MG PO TABS: 100.0000 mg | ORAL_TABLET | Freq: Two times a day (BID) | ORAL | Status: DC

## 2015-09-16 NOTE — Progress Notes (Signed)
Patient ID: Tasha Woods, female    DOB: 02-07-1971  Age: 45 y.o. MRN: [id number]  CC: Sinusitis and Cough   HPI NARE GASPARI presents for CC of sinusitis x 10 days.   1) Started 1 week ago Sat.  ST, rhinorrhea- yellow mucous, blood streaked Cough- dry, PNDrip  Right ear pain  Stuffy Sinus pressure maxillary, teeth, throbbing headache   Treatment to date: Tylenol Cold Mucinex cold and flu  Flonase   Sick contacts- Husband   History Yaritzi has a past medical history of Anxiety; GERD (gastroesophageal reflux disease); and Allergy.   She has past surgical history that includes Inner ear surgery; Colonoscopy (04-30-14); Upper gi endoscopy (04-30-14); and Cholecystectomy (06/08/14).   Her family history includes Colon polyps in her mother; Hypertension in her father. There is no history of Breast cancer or Colon cancer.She reports that she has never smoked. She has never used smokeless tobacco. She reports that she drinks alcohol. She reports that she does not use illicit drugs.  Outpatient Prescriptions Prior to Visit  Medication Sig Dispense Refill  . ALPRAZolam (XANAX) 0.25 MG tablet Take 1 tablet (0.25 mg total) by mouth daily as needed. 20 tablet 0  . escitalopram (LEXAPRO) 20 MG tablet Take 1 tablet by mouth  daily 90 tablet 1  . esomeprazole (NEXIUM) 40 MG capsule Take 40 mg by mouth daily at 12 noon.    . fluticasone (FLONASE) 50 MCG/ACT nasal spray Place 2 sprays into both nostrils daily. 48 g 1  . ondansetron (ZOFRAN) 4 MG tablet Take 4 mg by mouth every 8 (eight) hours as needed for nausea or vomiting. Reported on 09/16/2015    . traMADol (ULTRAM) 50 MG tablet Take 50 mg by mouth every 6 (six) hours as needed. Reported on 09/16/2015    . HYDROcodone-acetaminophen (NORCO/VICODIN) 5-325 MG per tablet Take 1 tablet by mouth every 6 (six) hours as needed for moderate pain. Reported on 09/16/2015     No facility-administered medications prior to visit.    ROS Review of Systems   Constitutional: Negative for fever, chills, diaphoresis and fatigue.  HENT: Positive for congestion, ear pain, nosebleeds, postnasal drip, rhinorrhea, sinus pressure and sore throat. Negative for sneezing, trouble swallowing and voice change.   Respiratory: Positive for cough. Negative for chest tightness, shortness of breath and wheezing.   Cardiovascular: Negative for chest pain, palpitations and leg swelling.  Gastrointestinal: Negative for nausea, vomiting and diarrhea.  Skin: Negative for rash.  Neurological: Negative for dizziness and headaches.    Objective:  BP 102/62 mmHg  Pulse 76  Temp(Src) 98.1 F (36.7 C) (Oral)  Resp 14  Ht 5' 3"  (1.6 m)  Wt 185 lb 12.8 oz (84.278 kg)  BMI 32.92 kg/m2  SpO2 97%  Physical Exam  Constitutional: She is oriented to person, place, and time. She appears well-developed and well-nourished. No distress.  HENT:  Head: Normocephalic and atraumatic.  Right Ear: External ear normal.  Left Ear: External ear normal.  Mouth/Throat: Oropharynx is clear and moist. No oropharyngeal exudate.  TM's clear bilaterally Maxillary tenderness bilaterally  Edematous nasal mucosa  Eyes: EOM are normal. Pupils are equal, round, and reactive to light. Right eye exhibits no discharge. Left eye exhibits no discharge. No scleral icterus.  Neck: Normal range of motion. Neck supple.  Cardiovascular: Normal rate, regular rhythm and normal heart sounds.  Exam reveals no gallop and no friction rub.   No murmur heard. Pulmonary/Chest: Effort normal and breath sounds normal. No respiratory  distress. She has no wheezes. She has no rales. She exhibits no tenderness.  Lymphadenopathy:    She has no cervical adenopathy.  Neurological: She is alert and oriented to person, place, and time. No cranial nerve deficit. She exhibits normal muscle tone. Coordination normal.  Skin: Skin is warm and dry. No rash noted. She is not diaphoretic.  Psychiatric: She has a normal mood and  affect. Her behavior is normal. Judgment and thought content normal.   Assessment & Plan:   Germani was seen today for sinusitis and cough.  Diagnoses and all orders for this visit:  Acute non-recurrent maxillary sinusitis  Other orders -     chlorpheniramine-HYDROcodone (TUSSIONEX PENNKINETIC ER) 10-8 MG/5ML SUER; Take 5 mLs by mouth at bedtime as needed for cough. -     doxycycline (VIBRA-TABS) 100 MG tablet; Take 1 tablet (100 mg total) by mouth 2 (two) times daily.  I have discontinued Ms. Schoeller's HYDROcodone-acetaminophen. I am also having her start on chlorpheniramine-HYDROcodone and doxycycline. Additionally, I am having her maintain her ALPRAZolam, ondansetron, traMADol, esomeprazole, fluticasone, escitalopram, and dextromethorphan-guaiFENesin.  Meds ordered this encounter  Medications  . dextromethorphan-guaiFENesin (MUCINEX DM) 30-600 MG 12hr tablet    Sig: Take 1 tablet by mouth 2 (two) times daily.  . chlorpheniramine-HYDROcodone (TUSSIONEX PENNKINETIC ER) 10-8 MG/5ML SUER    Sig: Take 5 mLs by mouth at bedtime as needed for cough.    Dispense:  115 mL    Refill:  0    Order Specific Question:  Supervising Provider    Answer:  Derrel Nip, TERESA L [[id number]]  . doxycycline (VIBRA-TABS) 100 MG tablet    Sig: Take 1 tablet (100 mg total) by mouth 2 (two) times daily.    Dispense:  14 tablet    Refill:  0    Order Specific Question:  Supervising Provider    Answer:  Crecencio Mc [[id number]]     Follow-up: Return if symptoms worsen or fail to improve.

## 2015-09-16 NOTE — Patient Instructions (Addendum)
Please take a probiotic ( Align, Floraque or Culturelle) while you are on the antibiotic to prevent a serious antibiotic associated diarrhea  Called clostirudium dificile colitis and a vaginal yeast infection.   5 mL (1 teaspoon) of cough syrup. Don't drive or make important decisions while taking this....just sleep and rest.

## 2015-09-20 DIAGNOSIS — J0100 Acute maxillary sinusitis, unspecified: Secondary | ICD-10-CM | POA: Insufficient documentation

## 2015-09-20 NOTE — Assessment & Plan Note (Addendum)
New Onset Due to length of symptoms with worsening will treat empirically  Doxycyline was sent to the pharmacy Encouraged Probiotics Tussionex given to pt to take to pharmacy, instructions verbal and on AVS with caution about drowsy effects Continue OTC measures  FU prn worsening/failure to improve.

## 2015-12-23 DIAGNOSIS — K760 Fatty (change of) liver, not elsewhere classified: Secondary | ICD-10-CM | POA: Insufficient documentation

## 2015-12-26 ENCOUNTER — Other Ambulatory Visit: Payer: Self-pay | Admitting: Nurse Practitioner

## 2015-12-26 DIAGNOSIS — K760 Fatty (change of) liver, not elsewhere classified: Secondary | ICD-10-CM

## 2015-12-26 DIAGNOSIS — R748 Abnormal levels of other serum enzymes: Secondary | ICD-10-CM

## 2015-12-30 ENCOUNTER — Other Ambulatory Visit: Payer: Self-pay | Admitting: Internal Medicine

## 2015-12-30 DIAGNOSIS — Z1231 Encounter for screening mammogram for malignant neoplasm of breast: Secondary | ICD-10-CM

## 2015-12-31 ENCOUNTER — Ambulatory Visit: Payer: 59

## 2016-01-08 ENCOUNTER — Ambulatory Visit
Admission: RE | Admit: 2016-01-08 | Discharge: 2016-01-08 | Disposition: A | Discharge status: Home / Self Care | Payer: 59 | Source: Ambulatory Visit | Attending: Internal Medicine | Admitting: Internal Medicine

## 2016-01-08 DIAGNOSIS — Z1231 Encounter for screening mammogram for malignant neoplasm of breast: Secondary | ICD-10-CM | POA: Diagnosis not present

## 2016-02-07 ENCOUNTER — Other Ambulatory Visit: Payer: Self-pay | Admitting: Internal Medicine

## 2016-02-08 NOTE — Telephone Encounter (Signed)
Last seen in July 2016. No future appt scheduled. Okay to refill?

## 2016-02-09 NOTE — Telephone Encounter (Signed)
Pt needs a physical scheduled.  Please schedule and then refill medication until physical.  I do not want her to run out.

## 2016-02-10 NOTE — Telephone Encounter (Signed)
See drug warning with Tramadol Okay to refill?

## 2016-02-10 NOTE — Telephone Encounter (Signed)
The patient has been notified and scheduled for her physical on 8.17.17.

## 2016-02-10 NOTE — Telephone Encounter (Signed)
Please schedule appointment as instructed, then send back for refill.

## 2016-02-11 NOTE — Telephone Encounter (Signed)
Need to clarify if she is taking tramadol.  We have not prescribed.  If taking, how often?

## 2016-02-12 ENCOUNTER — Other Ambulatory Visit: Payer: Self-pay

## 2016-02-12 ENCOUNTER — Encounter: Payer: Self-pay | Admitting: *Deleted

## 2016-02-12 NOTE — Telephone Encounter (Signed)
Error

## 2016-02-12 NOTE — Telephone Encounter (Signed)
Sent mychart message

## 2016-02-13 ENCOUNTER — Other Ambulatory Visit: Payer: Self-pay | Admitting: *Deleted

## 2016-02-13 ENCOUNTER — Other Ambulatory Visit: Payer: Self-pay | Admitting: Internal Medicine

## 2016-02-13 MED ORDER — ESCITALOPRAM OXALATE 20 MG PO TABS: ORAL_TABLET | ORAL | Status: DC

## 2016-02-13 NOTE — Telephone Encounter (Signed)
rx sent in for lexapro #90 with no refills since not on tramadol.

## 2016-02-13 NOTE — Progress Notes (Signed)
ok'd refill for lexapro #90 with one refill.

## 2016-02-13 NOTE — Telephone Encounter (Signed)
Pt states that she is NOT taking Tramadol. Refill refused

## 2016-04-16 ENCOUNTER — Ambulatory Visit (INDEPENDENT_AMBULATORY_CARE_PROVIDER_SITE_OTHER): Payer: 59 | Admitting: Internal Medicine

## 2016-04-16 ENCOUNTER — Other Ambulatory Visit: Payer: Self-pay | Admitting: Internal Medicine

## 2016-04-16 ENCOUNTER — Encounter: Payer: Self-pay | Admitting: Internal Medicine

## 2016-04-16 VITALS — BP 104/70 | HR 65 | Ht 63.75 in | Wt 185.0 lb

## 2016-04-16 DIAGNOSIS — Z0000 Encounter for general adult medical examination without abnormal findings: Secondary | ICD-10-CM | POA: Diagnosis not present

## 2016-04-16 DIAGNOSIS — Z124 Encounter for screening for malignant neoplasm of cervix: Secondary | ICD-10-CM

## 2016-04-16 DIAGNOSIS — E669 Obesity, unspecified: Secondary | ICD-10-CM | POA: Diagnosis not present

## 2016-04-16 DIAGNOSIS — Z83719 Family history of colon polyps, unspecified: Secondary | ICD-10-CM

## 2016-04-16 DIAGNOSIS — F439 Reaction to severe stress, unspecified: Secondary | ICD-10-CM

## 2016-04-16 DIAGNOSIS — M722 Plantar fascial fibromatosis: Secondary | ICD-10-CM

## 2016-04-16 DIAGNOSIS — Z658 Other specified problems related to psychosocial circumstances: Secondary | ICD-10-CM | POA: Diagnosis not present

## 2016-04-16 DIAGNOSIS — K2100 Gastro-esophageal reflux disease with esophagitis, without bleeding: Secondary | ICD-10-CM

## 2016-04-16 DIAGNOSIS — Z8371 Family history of colonic polyps: Secondary | ICD-10-CM | POA: Diagnosis not present

## 2016-04-16 DIAGNOSIS — Z13220 Encounter for screening for lipoid disorders: Secondary | ICD-10-CM

## 2016-04-16 MED ORDER — PANTOPRAZOLE SODIUM 40 MG PO TBEC: 40.0000 mg | DELAYED_RELEASE_TABLET | Freq: Every day | ORAL | 2 refills | Status: DC

## 2016-04-16 MED ORDER — CITALOPRAM HYDROBROMIDE 10 MG PO TABS: ORAL_TABLET | ORAL | 1 refills | Status: DC

## 2016-04-16 NOTE — Patient Instructions (Signed)
Take lexapro 39m per day for one week and then stop.  Start citalopram 149mper day for one week and then increase to 201mer day.

## 2016-04-16 NOTE — Assessment & Plan Note (Addendum)
Physical today 04/16/16.  mammogram 01/08/16 - Birads I.  Colonoscopy 04/30/14.  Recommended f/u colonoscopy in 04/2019.  PAP 04/16/16.

## 2016-04-16 NOTE — Progress Notes (Addendum)
Patient ID: Tasha Woods, female   DOB: 12/27/1970, 45 y.o.   MRN: [id number]   Subjective:    Patient ID: Tasha Woods, female    DOB: 09/18/1970, 45 y.o.   MRN: [id number]  HPI  Patient here for her physical exam.  She has been having trouble with plantar fasciitis.  Present over the last several months.  Is better now.  Discussed stretches, supports, etc.  She is concerned about her weight.  Discussed diet and exercise.  No chest pain.  No sob.  No acid reflux.  No abdominal pain or cramping.  Bowel stable.  She is on lexapro.  Wants to change to citalopram.  Feels this will not cause as much weight gain.  Discussed that these are similar medications.  She would like to try the change.     Past Medical History:  Diagnosis Date  . Allergy   . Anxiety   . GERD (gastroesophageal reflux disease)    Past Surgical History:  Procedure Laterality Date  . CHOLECYSTECTOMY  06/08/14  . COLONOSCOPY  04-30-14   Dr Vira Agar  . INNER EAR SURGERY     age 62  . UPPER GI ENDOSCOPY  04-30-14   Dr Vira Agar   Family History  Problem Relation Age of Onset  . Colon polyps Mother   . Hypertension Father   . Breast cancer Neg Hx   . Colon cancer Neg Hx    Social History   Social History  . Marital status: Married    Spouse name: N/A  . Number of children: 2  . 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 Smoking status: Never Smoker  . Smokeless tobacco: Never Used  . Alcohol use 0.0 oz/week     Comment: occasionally  . Drug use: No  . Sexual activity: Not Asked   Other Topics Concern  . None   Social History Narrative  . None    Outpatient Encounter Prescriptions as of 04/16/2016  Medication Sig  . ALPRAZolam (XANAX) 0.25 MG tablet Take 1 tablet (0.25 mg total) by mouth daily as needed.  . chlorpheniramine-HYDROcodone (TUSSIONEX PENNKINETIC ER) 10-8 MG/5ML SUER Take 5 mLs by mouth at bedtime as needed for cough.  .  dextromethorphan-guaiFENesin (MUCINEX DM) 30-600 MG 12hr tablet Take 1 tablet by mouth 2 (two) times daily.  Marland Kitchen doxycycline (VIBRA-TABS) 100 MG tablet Take 1 tablet (100 mg total) by mouth 2 (two) times daily.  Marland Kitchen escitalopram (LEXAPRO) 20 MG tablet Take 1 tablet by mouth  daily  . fluticasone (FLONASE) 50 MCG/ACT nasal spray Place 2 sprays into both nostrils daily.  . ondansetron (ZOFRAN) 4 MG tablet Take 4 mg by mouth every 8 (eight) hours as needed for nausea or vomiting. Reported on 09/16/2015  . [DISCONTINUED] esomeprazole (NEXIUM) 40 MG capsule Take 40 mg by mouth daily at 12 noon.  . citalopram (CELEXA) 10 MG tablet Take one tablet per day for one week and then two tablets per day  . pantoprazole (PROTONIX) 40 MG tablet Take 1 tablet (40 mg total) by mouth daily.   No facility-administered encounter medications on file as of 04/16/2016.     Review of Systems  Constitutional: Negative for appetite change and unexpected weight change.       Is concerned about weight gain.   HENT: Negative for congestion and sinus pressure.   Eyes: Negative for pain and visual disturbance.  Respiratory: Negative for  cough, chest tightness and shortness of breath.   Cardiovascular: Negative for chest pain, palpitations and leg swelling.  Gastrointestinal: Negative for abdominal pain, diarrhea, nausea and vomiting.  Genitourinary: Negative for difficulty urinating and dysuria.  Musculoskeletal: Negative for back pain and joint swelling.  Skin: Negative for color change and rash.  Neurological: Negative for dizziness, light-headedness and headaches.  Hematological: Negative for adenopathy. Does not bruise/bleed easily.  Psychiatric/Behavioral: Negative for agitation and dysphoric mood.       Objective:     Blood pressure rechecked by me:  112/70  Physical Exam  Constitutional: She is oriented to person, place, and time. She appears well-developed and well-nourished. No distress.  HENT:  Nose: Nose  normal.  Mouth/Throat: Oropharynx is clear and moist.  Eyes: Right eye exhibits no discharge. Left eye exhibits no discharge. No scleral icterus.  Neck: Neck supple. No thyromegaly present.  Cardiovascular: Normal rate and regular rhythm.   Pulmonary/Chest: Breath sounds normal. No accessory muscle usage. No tachypnea. No respiratory distress. She has no decreased breath sounds. She has no wheezes. She has no rhonchi. Right breast exhibits no inverted nipple, no mass, no nipple discharge and no tenderness (no axillary adenopathy). Left breast exhibits no inverted nipple, no mass, no nipple discharge and no tenderness (no axilarry adenopathy).  Abdominal: Soft. Bowel sounds are normal. There is no tenderness.  Genitourinary:  Genitourinary Comments: Normal external genitalia.  Vaginal vault without lesions.  Cervix identified.  Pap smear performed.  Could not appreciate any adnexal masses or tenderness.    Musculoskeletal: She exhibits no edema or tenderness.  Lymphadenopathy:    She has no cervical adenopathy.  Neurological: She is alert and oriented to person, place, and time.  Skin: Skin is warm. No rash noted. No erythema.  Psychiatric: She has a normal mood and affect. Her behavior is normal.    BP 104/70   Pulse 65   Ht 5' 3.75" (1.619 m)   Wt 185 lb (83.9 kg)   SpO2 98%   BMI 32.00 kg/m  Wt Readings from Last 3 Encounters:  04/16/16 185 lb (83.9 kg)  09/16/15 185 lb 12.8 oz (84.3 kg)  03/07/15 182 lb 2 oz (82.6 kg)     Lab Results  Component Value Date   WBC 5.9 05/08/2015   HGB 13.2 05/08/2015   HCT 38 05/08/2015   PLT 224 05/08/2015   GLUCOSE 88 04/23/2014   CHOL 174 05/08/2015   TRIG 137 05/08/2015   HDL 43 05/08/2015   LDLCALC 104 05/08/2015   ALT 42 (A) 05/08/2015   AST 31 05/08/2015   NA 141 05/08/2015   K 4.6 05/08/2015   CL 108 (H) 04/23/2014   CREATININE 0.9 05/08/2015   BUN 14 05/08/2015   CO2 24 04/23/2014   TSH 3.57 05/08/2015    Mm Digital  Screening Bilateral  Result Date: 01/08/2016 CLINICAL DATA:  Screening. EXAM: DIGITAL SCREENING BILATERAL MAMMOGRAM WITH CAD COMPARISON:  Previous exam(s). ACR Breast Density Category b: There are scattered areas of fibroglandular density. FINDINGS: There are no findings suspicious for malignancy. Images were processed with CAD. IMPRESSION: No mammographic evidence of malignancy. A result letter of this screening mammogram will be mailed directly to the patient. RECOMMENDATION: Screening mammogram in one year. (Code:SM-B-01Y) BI-RADS CATEGORY  1: Negative. Electronically Signed   By: Pamelia Hoit M.D.   On: 01/08/2016 09:37       Assessment & Plan:   Problem List Items Addressed This Visit    Family history  of colonic polyps    Colonoscopy 04/30/14.  Recommended f/u colonoscopy in 04/2019.        Health care maintenance    Physical today 04/16/16.  mammogram 01/08/16 - Birads I.  Colonoscopy 04/30/14.  Recommended f/u colonoscopy in 04/2019.  PAP 04/16/16.       Obesity (BMI 30-39.9)    Diet and exercise - discussed.  Follow.       Reflux esophagitis    On protonix.  Symptoms controlled.        Relevant Orders   CBC with Differential/Platelet   TSH   Hepatic function panel   Basic metabolic panel   Stress    On lexapro.  Wants to change to citalopram as outlined.  Discussed change.  rx sent in.         Other Visit Diagnoses    Pap smear for cervical cancer screening    -  Primary   Relevant Orders   Cytology - PAP   Plantar fasciitis       Is better.  follow.  stretches.     Routine general medical examination at a health care facility       Screening cholesterol level       Relevant Orders   Lipid panel       Einar Pheasant, MD

## 2016-04-20 ENCOUNTER — Telehealth: Payer: Self-pay | Admitting: Internal Medicine

## 2016-04-20 NOTE — Telephone Encounter (Signed)
I spoke with Tasha DandyMary in Cytology & she is going to try & send to pap to [facility] from her dept. If she is unable to, she will send it back to us to send off from our office.

## 2016-04-20 NOTE — Telephone Encounter (Signed)
Please advise on this.  

## 2016-04-20 NOTE — Telephone Encounter (Signed)
Stanton Kidney called from the hospital asking about a pap smear specimen they received. She thinks it should've went to [facility]. Please give her a phone call regarding this.  Tasha Woods's ph# [phone] Thank you.

## 2016-04-22 ENCOUNTER — Encounter: Payer: Self-pay | Admitting: Internal Medicine

## 2016-04-22 NOTE — Assessment & Plan Note (Signed)
On lexapro.  Wants to change to citalopram as outlined.  Discussed change.  rx sent in.

## 2016-04-22 NOTE — Assessment & Plan Note (Signed)
Diet and exercise - discussed.  Follow.

## 2016-04-22 NOTE — Assessment & Plan Note (Signed)
On protonix.  Symptoms controlled.

## 2016-04-22 NOTE — Addendum Note (Signed)
Addended by: Alisa Graff on: 04/22/2016 06:49 AM   Modules accepted: Orders

## 2016-04-22 NOTE — Assessment & Plan Note (Signed)
Colonoscopy 04/30/14.  Recommended f/u colonoscopy in 04/2019.

## 2016-05-01 LAB — PAP LB, HPV-H+LR
HPV DNA High Risk: NEGATIVE
HPV DNA Low Risk: NEGATIVE
PAP Smear Comment: 0

## 2016-05-02 ENCOUNTER — Encounter: Payer: Self-pay | Admitting: Internal Medicine

## 2016-05-06 NOTE — Telephone Encounter (Signed)
Unread mychart message mailed to patient 

## 2016-05-11 ENCOUNTER — Encounter: Payer: Self-pay | Admitting: Internal Medicine

## 2016-05-11 MED ORDER — CITALOPRAM HYDROBROMIDE 20 MG PO TABS: 20.0000 mg | ORAL_TABLET | Freq: Every day | ORAL | 1 refills | Status: DC

## 2016-05-11 MED ORDER — PANTOPRAZOLE SODIUM 40 MG PO TBEC: 40.0000 mg | DELAYED_RELEASE_TABLET | Freq: Every day | ORAL | 1 refills | Status: DC

## 2016-05-11 NOTE — Telephone Encounter (Signed)
rx sent in for protonix and celexa #90 with one refill.

## 2016-05-11 NOTE — Telephone Encounter (Signed)
Ok to [place] visit 04/16/16

## 2016-05-14 ENCOUNTER — Encounter: Payer: Self-pay | Admitting: Internal Medicine

## 2016-05-15 MED ORDER — FLUTICASONE PROPIONATE 50 MCG/ACT NA SUSP: 2.0000 | Freq: Every day | NASAL | 1 refills | Status: DC

## 2016-05-26 ENCOUNTER — Encounter: Payer: Self-pay | Admitting: Internal Medicine

## 2016-05-26 MED ORDER — SCOPOLAMINE 1 MG/3DAYS TD PT72: 1.0000 | MEDICATED_PATCH | TRANSDERMAL | 0 refills | Status: DC

## 2016-05-26 NOTE — Telephone Encounter (Signed)
rx sent in to [address] for patches #10 with no refills.  Pt notified via my chart.

## 2016-06-11 ENCOUNTER — Encounter: Payer: Self-pay | Admitting: Internal Medicine

## 2016-06-17 NOTE — Telephone Encounter (Signed)
I have completed the form.  Placed in your box.  See my chart message.

## 2016-06-23 ENCOUNTER — Other Ambulatory Visit: Payer: Self-pay | Admitting: Internal Medicine

## 2016-08-03 ENCOUNTER — Other Ambulatory Visit: Payer: Self-pay | Admitting: Internal Medicine

## 2016-09-18 ENCOUNTER — Ambulatory Visit (INDEPENDENT_AMBULATORY_CARE_PROVIDER_SITE_OTHER): Payer: 59

## 2016-09-18 ENCOUNTER — Ambulatory Visit (INDEPENDENT_AMBULATORY_CARE_PROVIDER_SITE_OTHER): Payer: 59 | Admitting: Family Medicine

## 2016-09-18 ENCOUNTER — Encounter: Payer: Self-pay | Admitting: Family Medicine

## 2016-09-18 VITALS — BP 114/81 | HR 98 | Temp 97.6°F | Resp 16 | Wt 189.4 lb

## 2016-09-18 DIAGNOSIS — J209 Acute bronchitis, unspecified: Secondary | ICD-10-CM | POA: Diagnosis not present

## 2016-09-18 DIAGNOSIS — R062 Wheezing: Secondary | ICD-10-CM | POA: Diagnosis not present

## 2016-09-18 MED ORDER — ALBUTEROL SULFATE (2.5 MG/3ML) 0.083% IN NEBU: 2.5000 mg | INHALATION_SOLUTION | Freq: Four times a day (QID) | RESPIRATORY_TRACT | 1 refills | Status: DC | PRN

## 2016-09-18 MED ORDER — PREDNISONE 10 MG PO TABS: ORAL_TABLET | ORAL | 0 refills | Status: DC

## 2016-09-18 MED ORDER — ALBUTEROL SULFATE (2.5 MG/3ML) 0.083% IN NEBU
2.5000 mg | INHALATION_SOLUTION | Freq: Once | RESPIRATORY_TRACT | Status: AC
  Administered 2016-09-18: 2.5 mg via RESPIRATORY_TRACT

## 2016-09-18 NOTE — Patient Instructions (Signed)
Prednisone as prescribed.  We will call regarding a chest x-ray.  Albuterol as needed.  Take care  Dr. Adriana Simas

## 2016-09-18 NOTE — Progress Notes (Signed)
Subjective:  Patient ID: Tasha Woods, female    DOB: [birth year]  Age: 46 y.o. MRN: [id number]  CC: Cough, wheezing  HPI:  46 year old female presents with the above complaints.  Patient reports that she's been sick for the past 3 weeks. She's had cough, shortness of breath, wheezing, and chest tightness/pressure. She has been treated with antibiotics twice (Doxy). She was most recently seen on 1/5. Chest x-ray at that time was negative. EKG unremarkable. She was treated with cough medication and prednisone.  Patient presents today with continued symptoms. She states that she's had worsening cough, wheezing, and shortness of breath. She has taken the medication with some improvement but she had worsening of her symptoms on Tuesday. No fever. She reports orthopnea. Questionable PND. No reports of lower edema. She does note that she's had a years of second hand smoke exposure. She is a nonsmoker.   Social Hx   Social History   Social History  . Marital status: Married    Spouse name: N/A  . Number of children: 2  . 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 Smoking status: Never Smoker  . Smokeless tobacco: Never Used  . Alcohol use 0.0 oz/week     Comment: occasionally  . Drug use: No  . Sexual activity: Not Asked   Other Topics Concern  . None   Social History Narrative  . None    Review of Systems  Constitutional: Negative for fever.  Respiratory: Positive for cough, chest tightness, shortness of breath and wheezing.    Objective:  BP 114/81   Pulse 98   Temp 97.6 F (36.4 C) (Oral)   Resp 16   Wt 189 lb 6.4 oz (85.9 kg)   SpO2 96%   BMI 32.77 kg/m   BP/Weight 09/18/2016 04/16/2016 09/16/2015  Systolic BP 114 104 102  Diastolic BP 81 70 62  Wt. (Lbs) 189.4 185 185.8  BMI 32.77 32 32.92   Physical Exam  Constitutional: She is oriented to person, place, and time. She appears well-developed.  Audible  wheezing but in NAD.  HENT:  Oropharynx clear. Normal TM's bilaterally.    Neck: Neck supple. No JVD present.  Cardiovascular: Normal rate and regular rhythm.   Pulmonary/Chest: Effort normal.  Diffuse wheezing.  Lymphadenopathy:    She has no cervical adenopathy.  Neurological: She is alert and oriented to person, place, and time.  Psychiatric: She has a normal mood and affect.  Vitals reviewed.  Lab Results  Component Value Date   WBC 5.9 05/08/2015   HGB 13.2 05/08/2015   HCT 38 05/08/2015   PLT 224 05/08/2015   GLUCOSE 88 04/23/2014   CHOL 174 05/08/2015   TRIG 137 05/08/2015   HDL 43 05/08/2015   LDLCALC 104 05/08/2015   ALT 42 (A) 05/08/2015   AST 31 05/08/2015   NA 141 05/08/2015   K 4.6 05/08/2015   CL 108 (H) 04/23/2014   CREATININE 0.9 05/08/2015   BUN 14 05/08/2015   CO2 24 04/23/2014   TSH 3.57 05/08/2015    Assessment & Plan:   Problem List Items Addressed This Visit    Acute bronchitis - Primary    New acute problem. Given persistence of symptoms, obtaining chest x-ray. Significant wheezing on exam. Treating with prolonged prednisone course. Albuterol nebulizer. Obtaining a BNP to rule out CHF      Relevant Orders   DG Chest 2  View    Other Visit Diagnoses    Wheezing       Relevant Medications   albuterol (PROVENTIL) (2.5 MG/3ML) 0.083% nebulizer solution 2.5 mg (Completed)   Other Relevant Orders   B Nat Peptide     Meds ordered this encounter  Medications  . albuterol (PROVENTIL) (2.5 MG/3ML) 0.083% nebulizer solution 2.5 mg  . predniSONE (DELTASONE) 10 MG tablet    Sig: 50 mg daily x 3 days, then 40 mg daily x 3 days, then 30 mg daily x 3 days, then 20 mg daily x 3 days, then 10 mg daily x 3 days.    Dispense:  45 tablet    Refill:  0  . albuterol (PROVENTIL) (2.5 MG/3ML) 0.083% nebulizer solution    Sig: Take 3 mLs (2.5 mg total) by nebulization every 6 (six) hours as needed for wheezing or shortness of breath.    Dispense:  150 mL     Refill:  1   Follow-up: PRN  Everlene Other DO [facility]

## 2016-09-18 NOTE — Progress Notes (Signed)
Pre visit review using our clinic review tool, if applicable. No additional management support is needed unless otherwise documented below in the visit note. 

## 2016-09-18 NOTE — Assessment & Plan Note (Signed)
New acute problem. Given persistence of symptoms, obtaining chest x-ray. Significant wheezing on exam. Treating with prolonged prednisone course. Albuterol nebulizer. Obtaining a BNP to rule out CHF

## 2016-09-18 NOTE — Addendum Note (Signed)
Addended by: Warden Fillers on: 09/18/2016 02:08 PM   Modules accepted: Orders

## 2016-09-19 LAB — BRAIN NATRIURETIC PEPTIDE: BNP: 8.5 pg/mL (ref 0.0–100.0)

## 2016-09-22 ENCOUNTER — Encounter: Payer: Self-pay | Admitting: Family Medicine

## 2016-09-22 ENCOUNTER — Other Ambulatory Visit: Payer: Self-pay | Admitting: Family Medicine

## 2016-09-22 DIAGNOSIS — R062 Wheezing: Secondary | ICD-10-CM

## 2016-09-22 DIAGNOSIS — R0602 Shortness of breath: Secondary | ICD-10-CM

## 2016-09-25 ENCOUNTER — Encounter: Payer: Self-pay | Admitting: Pulmonary Disease

## 2016-09-25 ENCOUNTER — Ambulatory Visit (INDEPENDENT_AMBULATORY_CARE_PROVIDER_SITE_OTHER): Payer: 59 | Admitting: Pulmonary Disease

## 2016-09-25 VITALS — BP 132/72 | HR 78 | Wt 189.0 lb

## 2016-09-25 DIAGNOSIS — R0683 Snoring: Secondary | ICD-10-CM

## 2016-09-25 DIAGNOSIS — J45901 Unspecified asthma with (acute) exacerbation: Secondary | ICD-10-CM | POA: Diagnosis not present

## 2016-09-25 DIAGNOSIS — R05 Cough: Secondary | ICD-10-CM | POA: Diagnosis not present

## 2016-09-25 DIAGNOSIS — G479 Sleep disorder, unspecified: Secondary | ICD-10-CM

## 2016-09-25 DIAGNOSIS — R059 Cough, unspecified: Secondary | ICD-10-CM

## 2016-09-25 NOTE — Patient Instructions (Addendum)
Breo inhaler 200-25 strength - one inhalation daily. Samples provided Complete prednisone as prescribed Continue cough suppressant as prescribed Continue albuterol as inhaler or nebulizer as needed keeping track of of often you need it Follow up in 2 weeks to assess response to [facility] and address sleep issues At some point we will get lung function tests

## 2016-09-27 ENCOUNTER — Encounter: Payer: Self-pay | Admitting: Pulmonary Disease

## 2016-09-27 NOTE — Progress Notes (Signed)
PULMONARY CONSULT NOTE  Requesting MD/Service: Adriana Simas Date of initial consultation: 09/25/16 Reason for consultation: Cough, SOB  PT PROFILE: 46 y.o. F never smoker with one month of cough, SOB  HPI:  15 F referred by Dr Adriana Simas with the following history: on 08/26/16 she developed cough, ST and chest congestion. She was seen at an urgent care center in early January and treated wit prednisone, doxycycline and albuterol MDI. She improved some but her symptoms persisted and then relapsed fully approx one week prior to this visit. She was seen by Dr Adriana Simas and an albuterol nebulizer was administered with improvement. She is continued on the albuterol MDI and is presently on a prednisone taper prescribed by Dr Adriana Simas. Her cousin has a nebulizer and she has been using that as well. She is currently on prednisone @ 30 mg daily and tapering to off. She describes orthopnea but otherwise denies CP, fever, purulent sputum, hemoptysis, LE edema and calf tenderness. She has never smoked. She has suffered "bronchitis" approx 10 times as a adult. She was never told that she has asthma or possible asthma. She does sometimes suffer occasional symptoms of itchy eyes and nasal congestion but has never been formally diagnosed with allergies.  As an aside, she reports snoring and restless sleep which will be discussed further on follow up visits.   Past Medical History:  Diagnosis Date  . Allergy   . Anxiety   . GERD (gastroesophageal reflux disease)     Past Surgical History:  Procedure Laterality Date  . CHOLECYSTECTOMY  06/08/14  . COLONOSCOPY  04-30-14   Dr Mechele Collin  . INNER EAR SURGERY     age 87  . UPPER GI ENDOSCOPY  04-30-14   Dr Mechele Collin    MEDICATIONS: I have reviewed all medications and confirmed regimen as documented  Social History   Social History  . Marital status: Married    Spouse name: N/A  . Number of children: 2  . 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 Smoking status: Never Smoker  . Smokeless tobacco: Never Used  . Alcohol use 0.0 oz/week     Comment: occasionally  . Drug use: No  . Sexual activity: Not on file   Other Topics Concern  . Not on file   Social History Narrative  . No narrative on file    Family History  Problem Relation Age of Onset  . Colon polyps Mother   . Hypertension Father   . Breast cancer Neg Hx   . Colon cancer Neg Hx     ROS: No fever, myalgias/arthralgias, unexplained weight loss or weight gain No new focal weakness or sensory deficits No otalgia, hearing loss, visual changes, nasal and sinus symptoms, mouth and throat problems No neck pain or adenopathy No abdominal pain, N/V/D, diarrhea, change in bowel pattern No dysuria, change in urinary pattern   Vitals:   09/25/16 1054  BP: 132/72  Pulse: 78  SpO2: 96%  Weight: 189 lb (85.7 kg)     EXAM:  Gen: WDWN, No overt respiratory distress HEENT: NCAT, sclera white, oropharynx normal Neck: Supple without LAN, thyromegaly, JVD Lungs: breath sounds full and slightly coarse, percussion normal, No wheezes Cardiovascular: RRR, no murmurs noted Abdomen: Soft, nontender, normal BS Ext: without clubbing, cyanosis, edema Neuro: CNs grossly intact, motor and sensory intact Skin: Limited exam, no lesions noted  DATA:   BMP Latest Ref Rng & Units  05/08/2015 04/23/2014 04/11/2014  Glucose 65 - 99 mg/dL - 88 89  BUN 4 - 21 mg/dL 14 11 14   Creatinine 0.5 - 1.1 mg/dL 0.9 4.09 8.11  BUN/Creat Ratio 9 - 23 - - 19  Sodium 137 - 147 mmol/L 141 141 139  Potassium 3.4 - 5.3 mmol/L 4.6 4.4 4.6  Chloride 98 - 107 mmol/L - 108(H) 101  CO2 21 - 32 mmol/L - 24 21  Calcium 8.5 - 10.1 mg/dL - 9.1 9.7    CBC Latest Ref Rng & Units 05/08/2015 04/23/2014 04/11/2014  WBC 10:3/mL 5.9 6.5 8.1  Hemoglobin 12.0 - 16.0 g/dL 91.4 78.2 95.6  Hematocrit 36 - 46 % 38 41.9 43.4  Platelets 150 - 399 K/L 224 250 -    CXR (09/18/16):   normal  IMPRESSION:   1) Subacute cough, dyspnea responsive to steroids and bronchodilators - suspect acute asthmatic bronchitis 2) Poor quality sleep and report of snoring  PLAN:  Breo inhaler 200-25 strength - one inhalation daily. Samples provided Complete prednisone as prescribed Continue cough suppressant as prescribed Continue albuterol as inhaler or nebulizer as needed keeping track of of often she needs it Follow up in 2 weeks to assess response to [facility] and address sleep issues At some point we will get lung function tests   Billy Fischer, MD PCCM service Mobile [phone] Pager [phone] 09/27/2016

## 2016-10-05 ENCOUNTER — Encounter: Payer: Self-pay | Admitting: Pulmonary Disease

## 2016-10-05 MED ORDER — FLUTICASONE FUROATE-VILANTEROL 200-25 MCG/INH IN AEPB: 1.0000 | INHALATION_SPRAY | Freq: Every day | RESPIRATORY_TRACT | 0 refills | Status: AC

## 2016-10-05 NOTE — Telephone Encounter (Signed)
DS please advise. Thanks.

## 2016-10-09 ENCOUNTER — Encounter: Payer: Self-pay | Admitting: Pulmonary Disease

## 2016-10-09 ENCOUNTER — Ambulatory Visit (INDEPENDENT_AMBULATORY_CARE_PROVIDER_SITE_OTHER): Payer: 59 | Admitting: Pulmonary Disease

## 2016-10-09 VITALS — BP 118/88 | HR 86 | Wt 190.0 lb

## 2016-10-09 DIAGNOSIS — R059 Cough, unspecified: Secondary | ICD-10-CM

## 2016-10-09 DIAGNOSIS — R05 Cough: Secondary | ICD-10-CM | POA: Diagnosis not present

## 2016-10-09 DIAGNOSIS — J45909 Unspecified asthma, uncomplicated: Secondary | ICD-10-CM

## 2016-10-09 MED ORDER — FLUTICASONE-SALMETEROL 230-21 MCG/ACT IN AERO: 2.0000 | INHALATION_SPRAY | Freq: Two times a day (BID) | RESPIRATORY_TRACT | 12 refills | Status: DC

## 2016-10-09 MED ORDER — MONTELUKAST SODIUM 10 MG PO TABS: 10.0000 mg | ORAL_TABLET | Freq: Every day | ORAL | 11 refills | Status: DC

## 2016-10-09 NOTE — Progress Notes (Signed)
PULMONARY OFFICE FOLLOW UP NOTE  Requesting MD/Service: Adriana Simas Date of initial consultation: 09/25/16 Reason for consultation: Cough, SOB  PT PROFILE: 46 y.o. F never smoker with one month of cough, SOB. Suspected asthma  SUBJ:  Feels improvement in sleep and cough/wheezing with initiation of Breo inhaler but still using albuterol MDI approx 2 times per day. Reports an episode of wheezing after drinking a beer. She has GERD sx and is on PPI therapy. Denies CP, fever, purulent sputum, hemoptysis, LE edema and calf tenderness.   Vitals:   10/09/16 1337  BP: 118/88  Pulse: 86  SpO2: 99%  Weight: 190 lb (86.2 kg)     EXAM:  Gen: No overt respiratory distress HEENT: NCAT, sclera white, oropharynx normal Neck: Supple without LAN, thyromegaly, JVD Lungs: No wheezes Cardiovascular: RRR, no murmur Abdomen: Soft, nontender, normal BS Ext: without clubbing, cyanosis, edema Neuro: CNs grossly intact, motor and sensory intact Skin: Limited exam, no lesions noted  DATA:   BMP Latest Ref Rng & Units 05/08/2015 04/23/2014 04/11/2014  Glucose 65 - 99 mg/dL - 88 89  BUN 4 - 21 mg/dL 14 11 14   Creatinine 0.5 - 1.1 mg/dL 0.9 9.62 9.52  BUN/Creat Ratio 9 - 23 - - 19  Sodium 137 - 147 mmol/L 141 141 139  Potassium 3.4 - 5.3 mmol/L 4.6 4.4 4.6  Chloride 98 - 107 mmol/L - 108(H) 101  CO2 21 - 32 mmol/L - 24 21  Calcium 8.5 - 10.1 mg/dL - 9.1 9.7    CBC Latest Ref Rng & Units 05/08/2015 04/23/2014 04/11/2014  WBC 10:3/mL 5.9 6.5 8.1  Hemoglobin 12.0 - 16.0 g/dL 84.1 32.4 40.1  Hematocrit 36 - 46 % 38 41.9 43.4  Platelets 150 - 399 K/L 224 250 -    CXR: NNF  IMPRESSION:   Cough, Dyspnea, Suspected asthma with suboptimal response to ICS/LABA Cough is especially prevalent @ night  PLAN:  Cont Breo inhaler 200-25 strength - one inhalation daily. Samples provided Continue PRN albuterol  Change PPI to qHS Trial of montelukast 20 mg daily Follow up in 2-3 weeks  At some point we will get  lung function tests   Billy Fischer, MD PCCM service Mobile [phone] Pager [phone] 10/09/2016

## 2016-10-09 NOTE — Patient Instructions (Signed)
Continue Breo inhaler and Protonix at night  Add Singulair 10 mg daily - take in the morning  Follow up in 2-3 weeks

## 2016-10-15 ENCOUNTER — Encounter: Payer: Self-pay | Admitting: Pulmonary Disease

## 2016-10-15 ENCOUNTER — Other Ambulatory Visit: Payer: Self-pay | Admitting: *Deleted

## 2016-10-15 MED ORDER — AMBULATORY NON FORMULARY MEDICATION: 0 refills | Status: DC

## 2016-10-15 MED ORDER — FLUTTER DEVI: 0 refills | Status: DC

## 2016-10-15 NOTE — Telephone Encounter (Signed)
Pt calling stating she can order those two things on [place] Wanted to know why we were going to prescribe it Also wanted to know about getting a CT done.  Please advise.

## 2016-10-19 ENCOUNTER — Encounter: Payer: Self-pay | Admitting: Internal Medicine

## 2016-10-19 ENCOUNTER — Ambulatory Visit (INDEPENDENT_AMBULATORY_CARE_PROVIDER_SITE_OTHER): Payer: 59 | Admitting: Internal Medicine

## 2016-10-19 DIAGNOSIS — K2100 Gastro-esophageal reflux disease with esophagitis, without bleeding: Secondary | ICD-10-CM

## 2016-10-19 DIAGNOSIS — Z9109 Other allergy status, other than to drugs and biological substances: Secondary | ICD-10-CM

## 2016-10-19 DIAGNOSIS — E669 Obesity, unspecified: Secondary | ICD-10-CM | POA: Diagnosis not present

## 2016-10-19 DIAGNOSIS — F439 Reaction to severe stress, unspecified: Secondary | ICD-10-CM | POA: Diagnosis not present

## 2016-10-19 DIAGNOSIS — R05 Cough: Secondary | ICD-10-CM

## 2016-10-19 DIAGNOSIS — R059 Cough, unspecified: Secondary | ICD-10-CM

## 2016-10-19 MED ORDER — AZELASTINE HCL 0.1 % NA SOLN: 1.0000 | Freq: Two times a day (BID) | NASAL | 0 refills | Status: DC

## 2016-10-19 MED ORDER — FLUTICASONE PROPIONATE 50 MCG/ACT NA SUSP: 2.0000 | Freq: Every day | NASAL | 1 refills | Status: DC

## 2016-10-19 NOTE — Progress Notes (Signed)
Patient ID: Tasha Woods, female   DOB: 1971-03-06, 46 y.o.   MRN: [id number]   Subjective:    Patient ID: Tasha Woods, female    DOB: 31-Mar-1971, 46 y.o.   MRN: [id number]  HPI  Patient here for a scheduled follow up.  She reports having had problems with increased nasal congestion and stuffiness.  Some drainage.  Some yellow mucus production previously.  Using flonase.  On breo.  Taking protonix.  Recent visit with Dr Alva Garnet - added singulair.  With persistent drainage.  Feels needs something to help with the draiange.     Past Medical History:  Diagnosis Date  . Allergy   . Anxiety   . GERD (gastroesophageal reflux disease)    Past Surgical History:  Procedure Laterality Date  . CHOLECYSTECTOMY  06/08/14  . COLONOSCOPY  04-30-14   Dr Vira Agar  . INNER EAR SURGERY     age 19  . UPPER GI ENDOSCOPY  04-30-14   Dr Vira Agar   Family History  Problem Relation Age of Onset  . Colon polyps Mother   . Hypertension Father   . Breast cancer Neg Hx   . Colon cancer Neg Hx    Social History   Social History  . Marital status: Married    Spouse name: N/A  . Number of children: 2  . 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 Smoking status: Never Smoker  . Smokeless tobacco: Never Used  . Alcohol use 0.0 oz/week     Comment: occasionally  . Drug use: No  . Sexual activity: Not Asked   Other Topics Concern  . None   Social History Narrative  . None    Outpatient Encounter Prescriptions as of 10/19/2016  Medication Sig  . albuterol (PROAIR HFA) 108 (90 Base) MCG/ACT inhaler Inhale 2 puffs into the lungs every 4 (four) hours as needed for wheezing or shortness of breath.  Marland Kitchen albuterol (PROVENTIL) (2.5 MG/3ML) 0.083% nebulizer solution Take 3 mLs (2.5 mg total) by nebulization every 6 (six) hours as needed for wheezing or shortness of breath.  . ALPRAZolam (XANAX) 0.25 MG tablet Take 1 tablet (0.25 mg total) by mouth daily as  needed.  . AMBULATORY NON FORMULARY MEDICATION Medication Name: Incentive Spirometry Use 10-15 times daily.  . citalopram (CELEXA) 20 MG tablet TAKE 1 TABLET BY MOUTH  DAILY  . fluticasone (FLONASE) 50 MCG/ACT nasal spray Place 2 sprays into both nostrils daily.  . montelukast (SINGULAIR) 10 MG tablet Take 1 tablet (10 mg total) by mouth at bedtime.  . pantoprazole (PROTONIX) 40 MG tablet TAKE 1 TABLET BY MOUTH  DAILY  . Respiratory Therapy Supplies (FLUTTER) DEVI Use 10-15 times daily  . [DISCONTINUED] fluticasone (FLONASE) 50 MCG/ACT nasal spray Place 2 sprays into both nostrils daily.  . [DISCONTINUED] fluticasone-salmeterol (ADVAIR HFA) 230-21 MCG/ACT inhaler Inhale 2 puffs into the lungs 2 (two) times daily.  Marland Kitchen azelastine (ASTELIN) 0.1 % nasal spray Place 1 spray into both nostrils 2 (two) times daily. Use in each nostril as directed  . [DISCONTINUED] guaiFENesin-codeine (ROBITUSSIN AC) 100-10 MG/5ML syrup Take 5 mLs by mouth at bedtime as needed for cough.  . [DISCONTINUED] ondansetron (ZOFRAN) 4 MG tablet Take 4 mg by mouth every 8 (eight) hours as needed for nausea or vomiting. Reported on 09/16/2015   No facility-administered encounter medications on file as of 10/19/2016.     Review of Systems  Constitutional: Negative for appetite change and unexpected weight change.  HENT: Positive for congestion and postnasal drip.   Respiratory: Negative for cough, chest tightness and shortness of breath.   Cardiovascular: Negative for chest pain, palpitations and leg swelling.  Gastrointestinal: Negative for abdominal pain, diarrhea, nausea and vomiting.  Genitourinary: Negative for difficulty urinating and dysuria.  Musculoskeletal: Negative for back pain and joint swelling.  Skin: Negative for color change and rash.  Neurological: Negative for dizziness, light-headedness and headaches.  Psychiatric/Behavioral: Negative for agitation and dysphoric mood.       Objective:    Physical Exam   Constitutional: She appears well-developed and well-nourished. No distress.  HENT:  Nose: Nose normal.  Mouth/Throat: Oropharynx is clear and moist.  Neck: Neck supple. No thyromegaly present.  Cardiovascular: Normal rate and regular rhythm.   Pulmonary/Chest: Breath sounds normal. No respiratory distress. She has no wheezes.  Abdominal: Soft. Bowel sounds are normal. There is no tenderness.  Musculoskeletal: She exhibits no edema or tenderness.  Lymphadenopathy:    She has no cervical adenopathy.  Skin: No rash noted. No erythema.  Psychiatric: She has a normal mood and affect. Her behavior is normal.    BP 126/82 (BP Location: Left Arm, Patient Position: Sitting, Cuff Size: Large)   Pulse 83   Temp 98.6 F (37 C) (Oral)   Resp 16   Ht 5' 4"  (1.626 m)   Wt 192 lb (87.1 kg)   SpO2 98%   BMI 32.96 kg/m  Wt Readings from Last 3 Encounters:  10/29/16 192 lb (87.1 kg)  10/19/16 192 lb (87.1 kg)  10/09/16 190 lb (86.2 kg)     Lab Results  Component Value Date   WBC 5.9 05/08/2015   HGB 13.2 05/08/2015   HCT 38 05/08/2015   PLT 224 05/08/2015   GLUCOSE 88 04/23/2014   CHOL 174 05/08/2015   TRIG 137 05/08/2015   HDL 43 05/08/2015   LDLCALC 104 05/08/2015   ALT 42 (A) 05/08/2015   AST 31 05/08/2015   NA 141 05/08/2015   K 4.6 05/08/2015   CL 108 (H) 04/23/2014   CREATININE 0.9 05/08/2015   BUN 14 05/08/2015   CO2 24 04/23/2014   TSH 3.57 05/08/2015    Mm Digital Screening Bilateral  Result Date: 01/08/2016 CLINICAL DATA:  Screening. EXAM: DIGITAL SCREENING BILATERAL MAMMOGRAM WITH CAD COMPARISON:  Previous exam(s). ACR Breast Density Category b: There are scattered areas of fibroglandular density. FINDINGS: There are no findings suspicious for malignancy. Images were processed with CAD. IMPRESSION: No mammographic evidence of malignancy. A result letter of this screening mammogram will be mailed directly to the patient. RECOMMENDATION: Screening mammogram in one  year. (Code:SM-B-01Y) BI-RADS CATEGORY  1: Negative. Electronically Signed   By: Pamelia Hoit M.D.   On: 01/08/2016 09:37       Assessment & Plan:   Problem List Items Addressed This Visit    Cough    Cough is better.  With current symptoms as outlined.  Continue breo and singulair.  Continue flonase.  Add astelin.  Follow.  Keep f/u appt with Dr Alva Garnet.        Environmental allergies    With increased congestion now.  Using flonase and taking singulair.  Add astelin nasal spray.  Follow.  Has f/u with Dr Alva Garnet soon.       Obesity (BMI 30-39.9)    Diet and exercise.  Follow.        Reflux esophagitis    Symptoms  controlled on protonix.  Follow.        Stress    On citalopram and feels this is working well for her.  Follow.            Einar Pheasant, MD

## 2016-10-19 NOTE — Progress Notes (Signed)
Pre-visit discussion using our clinic review tool. No additional management support is needed unless otherwise documented below in the visit note.  

## 2016-10-29 ENCOUNTER — Ambulatory Visit (INDEPENDENT_AMBULATORY_CARE_PROVIDER_SITE_OTHER): Payer: 59 | Admitting: Pulmonary Disease

## 2016-10-29 ENCOUNTER — Encounter: Payer: Self-pay | Admitting: Pulmonary Disease

## 2016-10-29 VITALS — BP 128/78 | HR 86 | Wt 192.0 lb

## 2016-10-29 DIAGNOSIS — R490 Dysphonia: Secondary | ICD-10-CM | POA: Diagnosis not present

## 2016-10-29 DIAGNOSIS — J45909 Unspecified asthma, uncomplicated: Secondary | ICD-10-CM | POA: Diagnosis not present

## 2016-10-29 DIAGNOSIS — R059 Cough, unspecified: Secondary | ICD-10-CM

## 2016-10-29 DIAGNOSIS — K219 Gastro-esophageal reflux disease without esophagitis: Secondary | ICD-10-CM | POA: Diagnosis not present

## 2016-10-29 DIAGNOSIS — R05 Cough: Secondary | ICD-10-CM | POA: Diagnosis not present

## 2016-10-29 NOTE — Progress Notes (Signed)
PULMONARY OFFICE FOLLOW UP NOTE  Requesting MD/Service: Lacinda Axon Date of initial consultation: 09/25/16 Reason for consultation: Cough, SOB  PT PROFILE: 46 y.o. F never smoker with one month of cough, SOB. Suspected asthma  SUBJ:  Her cough is much improved. She believes that is 95% resolved. She is not using albuterol at all. Her only complaint is mild dysphonia.   Vitals:   10/29/16 0854  BP: 128/78  Pulse: 86  SpO2: 96%  Weight: 192 lb (87.1 kg)     EXAM:  Gen: No overt respiratory distress HEENT: NCAT, sclera white, oropharynx normal Neck: Supple without LAN, thyromegaly, JVD Lungs: No wheezes Cardiovascular: RRR, no murmur Abdomen: Soft, nontender, normal BS Ext: without clubbing, cyanosis, edema Neuro: CNs grossly intact, motor and sensory intact Skin: Limited exam, no lesions noted  DATA:   BMP Latest Ref Rng & Units 05/08/2015 04/23/2014 04/11/2014  Glucose 65 - 99 mg/dL - 88 89  BUN 4 - 21 mg/dL 14 11 14   Creatinine 0.5 - 1.1 mg/dL 0.9 0.80 0.74  BUN/Creat Ratio 9 - 23 - - 19  Sodium 137 - 147 mmol/L 141 141 139  Potassium 3.4 - 5.3 mmol/L 4.6 4.4 4.6  Chloride 98 - 107 mmol/L - 108(H) 101  CO2 21 - 32 mmol/L - 24 21  Calcium 8.5 - 10.1 mg/dL - 9.1 9.7    CBC Latest Ref Rng & Units 05/08/2015 04/23/2014 04/11/2014  WBC 10:3/mL 5.9 6.5 8.1  Hemoglobin 12.0 - 16.0 g/dL 13.2 14.0 14.1  Hematocrit 36 - 46 % 38 41.9 43.4  Platelets 150 - 399 K/L 224 250 -    CXR: NNF  IMPRESSION:   Cough  Suspected asthma - Plan: Pulmonary Function Test [facility] Only  Suspected gastroesophageal reflux disease  Dysphonia  Dysphonia is likely due to high-dose inhaled corticosteroids  PLAN:  Decrease Advair 230-21 to one inhalation twice a day Continue Protonix 40 mg before bedtime each night Continue Singulair 10 mg daily Follow-up in 3 months-we will obtain PFTs prior to that follow-up   Merton Border, MD PCCM service Mobile [phone] Pager  [phone] 10/29/2016

## 2016-10-29 NOTE — Patient Instructions (Addendum)
Decrease Advair to one inhalation twice a day Continue Protonix 40 mg before bedtime each night Continue Singulair Follow-up in 3-4 months-we will obtain lung function measurements prior to that follow-up

## 2016-10-30 ENCOUNTER — Other Ambulatory Visit: Payer: Self-pay | Admitting: *Deleted

## 2016-10-30 MED ORDER — FLUTICASONE-SALMETEROL 230-21 MCG/ACT IN AERO: 2.0000 | INHALATION_SPRAY | Freq: Two times a day (BID) | RESPIRATORY_TRACT | 3 refills | Status: DC

## 2016-11-01 ENCOUNTER — Encounter: Payer: Self-pay | Admitting: Internal Medicine

## 2016-11-01 DIAGNOSIS — R059 Cough, unspecified: Secondary | ICD-10-CM | POA: Insufficient documentation

## 2016-11-01 DIAGNOSIS — R05 Cough: Secondary | ICD-10-CM | POA: Insufficient documentation

## 2016-11-01 NOTE — Assessment & Plan Note (Signed)
Cough is better.  With current symptoms as outlined.  Continue breo and singulair.  Continue flonase.  Add astelin.  Follow.  Keep f/u appt with Dr Alva Garnet.

## 2016-11-01 NOTE — Assessment & Plan Note (Signed)
Diet and exercise.  Follow.  

## 2016-11-01 NOTE — Assessment & Plan Note (Signed)
With increased congestion now.  Using flonase and taking singulair.  Add astelin nasal spray.  Follow.  Has f/u with Dr Alva Garnet soon.

## 2016-11-01 NOTE — Assessment & Plan Note (Signed)
On citalopram and feels this is working well for her.  Follow.

## 2016-11-01 NOTE — Assessment & Plan Note (Signed)
Symptoms controlled on protonix.  Follow.

## 2016-12-01 ENCOUNTER — Ambulatory Visit: Payer: 59

## 2016-12-03 ENCOUNTER — Ambulatory Visit: Payer: 59 | Attending: Pulmonary Disease

## 2016-12-03 DIAGNOSIS — R05 Cough: Secondary | ICD-10-CM | POA: Insufficient documentation

## 2016-12-03 DIAGNOSIS — R0602 Shortness of breath: Secondary | ICD-10-CM | POA: Insufficient documentation

## 2016-12-03 DIAGNOSIS — J45909 Unspecified asthma, uncomplicated: Secondary | ICD-10-CM

## 2016-12-07 ENCOUNTER — Encounter: Payer: Self-pay | Admitting: Pulmonary Disease

## 2016-12-07 ENCOUNTER — Ambulatory Visit (INDEPENDENT_AMBULATORY_CARE_PROVIDER_SITE_OTHER): Payer: 59 | Admitting: Pulmonary Disease

## 2016-12-07 VITALS — BP 122/78 | HR 90 | Wt 191.0 lb

## 2016-12-07 DIAGNOSIS — R490 Dysphonia: Secondary | ICD-10-CM

## 2016-12-07 DIAGNOSIS — K219 Gastro-esophageal reflux disease without esophagitis: Secondary | ICD-10-CM

## 2016-12-07 DIAGNOSIS — J45909 Unspecified asthma, uncomplicated: Secondary | ICD-10-CM

## 2016-12-07 DIAGNOSIS — R059 Cough, unspecified: Secondary | ICD-10-CM

## 2016-12-07 DIAGNOSIS — R05 Cough: Secondary | ICD-10-CM

## 2016-12-07 NOTE — Patient Instructions (Addendum)
Continue Advair as currently  Continue albuterol inhaler as needed  We discussed transitioning from omeprazole to famotidine (Pepcid): -Change omeprazole to 20 mg at bedtime (from 40 mg) -Begin famotidine 20 mg each morning -Do this for 3-4 weeks, then stop omeprazole and take famotidine 20 mg twice a day  Follow up in 3-4 months at which time we will consider trial off of Advair inhaler

## 2016-12-07 NOTE — Progress Notes (Signed)
PULMONARY OFFICE FOLLOW UP NOTE  Requesting MD/Service: Thersa Salt Date of initial consultation: 09/25/16 Reason for consultation: Cough, SOB  PT PROFILE: 46 y.o. F never smoker with one month of cough, SOB. Suspected asthma  DATA: 12/03/16 PFTs: FVC:  2.74 L ( 88%pred), FEV1:  2.26 L ( 87%pred), FEV1/FVC: 83%, TLC:  4.00L ( 83%pred), DLCO  80 %pred. Normal spirometry, low normal lung volumes and DLCO  INTERVAL: No major events since last visit 10/29/16  SUBJ:  Here for routine re-eval and to review PFT results (noted above) Cough is nearly completely resolved and not troubling to her. Dysphonia is resolved after reduced dose of Advair GERD symptoms are controlled She has used albuterol once (after exposure to heavy pollen)   Vitals:   12/07/16 0924 12/07/16 0925  BP:  122/78  Pulse:  90  SpO2:  95%  Weight: 86.6 kg (191 lb)      EXAM:  Gen: NAD HEENT: WNL Neck: Supple without LAN, JVD Lungs: Full BS, no wheezes Cardiovascular: RRR, no murmur Abdomen: Soft, nontender, normal BS Ext: without clubbing, cyanosis, edema Neuro: grossly intact  DATA:   BMP Latest Ref Rng & Units 05/08/2015 04/23/2014 04/11/2014  Glucose 65 - 99 mg/dL - 88 89  BUN 4 - 21 mg/dL 14 11 14   Creatinine 0.5 - 1.1 mg/dL 0.9 0.80 0.74  BUN/Creat Ratio 9 - 23 - - 19  Sodium 137 - 147 mmol/L 141 141 139  Potassium 3.4 - 5.3 mmol/L 4.6 4.4 4.6  Chloride 98 - 107 mmol/L - 108(H) 101  CO2 21 - 32 mmol/L - 24 21  Calcium 8.5 - 10.1 mg/dL - 9.1 9.7    CBC Latest Ref Rng & Units 05/08/2015 04/23/2014 04/11/2014  WBC 10:3/mL 5.9 6.5 8.1  Hemoglobin 12.0 - 16.0 g/dL 13.2 14.0 14.1  Hematocrit 36 - 46 % 38 41.9 43.4  Platelets 150 - 399 K/L 224 250 -    CXR: NNF  IMPRESSION:    Chronic cough - controlled Suspected GERD - long term PPI use Possible asthma - perhaps exacerbated by pollens Dysphonia - resolved  We discussed the small (but probably real) risk of complications of long term PPI use.  She wishes to try to transition off of PPI to H2RA  PLAN:  Continue Advair 230-21 - one inhalation twice a day Continue Singulair 10 mg daily We discussed transitioning from omeprazole to famotidine (Pepcid): -Change omeprazole to 20 mg at bedtime (from 40 mg) -Begin famotidine 20 mg each morning -Do this for 3-4 weeks, then stop omeprazole and take famotidine 20 mg twice a day Follow-up in 3-4 weeks   Merton Border, MD PCCM service Mobile [phone] Pager [phone] 12/07/2016

## 2016-12-18 ENCOUNTER — Other Ambulatory Visit: Payer: Self-pay | Admitting: *Deleted

## 2016-12-18 MED ORDER — MONTELUKAST SODIUM 10 MG PO TABS: 10.0000 mg | ORAL_TABLET | Freq: Every day | ORAL | 3 refills | Status: DC

## 2017-02-08 ENCOUNTER — Other Ambulatory Visit: Payer: Self-pay | Admitting: Internal Medicine

## 2017-02-08 DIAGNOSIS — Z1231 Encounter for screening mammogram for malignant neoplasm of breast: Secondary | ICD-10-CM

## 2017-02-16 ENCOUNTER — Ambulatory Visit
Admission: RE | Admit: 2017-02-16 | Discharge: 2017-02-16 | Disposition: A | Discharge status: Home / Self Care | Payer: 59 | Source: Ambulatory Visit | Attending: Internal Medicine | Admitting: Internal Medicine

## 2017-02-16 DIAGNOSIS — Z1231 Encounter for screening mammogram for malignant neoplasm of breast: Secondary | ICD-10-CM | POA: Diagnosis present

## 2017-03-22 ENCOUNTER — Ambulatory Visit (INDEPENDENT_AMBULATORY_CARE_PROVIDER_SITE_OTHER): Payer: 59 | Admitting: Pulmonary Disease

## 2017-03-22 ENCOUNTER — Encounter: Payer: Self-pay | Admitting: Pulmonary Disease

## 2017-03-22 VITALS — BP 122/82 | HR 89 | Resp 16 | Ht 64.0 in | Wt 186.0 lb

## 2017-03-22 DIAGNOSIS — J4541 Moderate persistent asthma with (acute) exacerbation: Secondary | ICD-10-CM | POA: Diagnosis not present

## 2017-03-22 DIAGNOSIS — R059 Cough, unspecified: Secondary | ICD-10-CM

## 2017-03-22 DIAGNOSIS — K219 Gastro-esophageal reflux disease without esophagitis: Secondary | ICD-10-CM | POA: Diagnosis not present

## 2017-03-22 DIAGNOSIS — R05 Cough: Secondary | ICD-10-CM | POA: Diagnosis not present

## 2017-03-22 MED ORDER — PREDNISONE 20 MG PO TABS: 40.0000 mg | ORAL_TABLET | Freq: Every day | ORAL | 0 refills | Status: AC

## 2017-03-22 MED ORDER — FAMOTIDINE 40 MG PO TABS: 40.0000 mg | ORAL_TABLET | Freq: Every evening | ORAL | 10 refills | Status: DC

## 2017-03-22 NOTE — Patient Instructions (Signed)
Continue Advair - 2 puffs twice a day  More liberal use of albuterol (pro-air) as needed for shortness of breath, cough, chest tightness, wheezing  Change famotidine to 40 mg daily taken 1-2 hours prior to bedtime  Prednisone 40 mg daily for 5 days, then stop  Follow-up in 3-4 months or sooner as needed

## 2017-03-22 NOTE — Progress Notes (Signed)
PULMONARY OFFICE FOLLOW UP NOTE  Requesting MD/Service: Everlene Other Date of initial consultation: 09/25/16 Reason for consultation: Cough, SOB  PT PROFILE: 46 y.o. F never smoker with one month of cough, SOB. Suspected asthma  DATA: 12/03/16 PFTs: FVC:  2.74 L ( 88%pred), FEV1:  2.26 L ( 87%pred), FEV1/FVC: 83%, TLC:  4.00L ( 83%pred), DLCO  80 %pred. Normal spirometry, low normal lung volumes and DLCO  INTERVAL: No major events since last visit   SUBJ:  Here for routine re-eval. She was doing well until approximately 2 weeks ago when she developed persistent cough and chest congestion. He has difficulty coughing up mucus but when she does it is mildly discolored. She denies hemoptysis. She has had no change in her exertional dyspnea. She is not using the albuterol inhaler. She continues on Advair HFA inhaler at the maximum strength. She has infrequent symptoms of heartburn. Since last visit, she is transitioned off of omeprazole to famotidine and is taking 20 mg once a day.  Vitals:   03/22/17 1132  BP: 122/82  Pulse: 89  Resp: 16  SpO2: 95%  Weight: 186 lb (84.4 kg)  Height: 5\' 4"  (1.626 m)     EXAM:  Gen: NAD HEENT: WNL Neck: Supple without LAN, JVD Lungs: Full BS, Scattered expiratory wheezes Cardiovascular: RRR, no murmur Abdomen: Soft, nontender, normal BS Ext: without clubbing, cyanosis, edema Neuro: grossly intact  DATA:   BMP Latest Ref Rng & Units 05/08/2015 04/23/2014 04/11/2014  Glucose 65 - 99 mg/dL - 88 89  BUN 4 - 21 mg/dL 14 11 14   Creatinine 0.5 - 1.1 mg/dL 0.9 4.85 4.62  BUN/Creat Ratio 9 - 23 - - 19  Sodium 137 - 147 mmol/L 141 141 139  Potassium 3.4 - 5.3 mmol/L 4.6 4.4 4.6  Chloride 98 - 107 mmol/L - 108(H) 101  CO2 21 - 32 mmol/L - 24 21  Calcium 8.5 - 10.1 mg/dL - 9.1 9.7    CBC Latest Ref Rng & Units 05/08/2015 04/23/2014 04/11/2014  WBC 10:3/mL 5.9 6.5 8.1  Hemoglobin 12.0 - 16.0 g/dL 70.3 50.0 93.8  Hematocrit 36 - 46 % 38 41.9 43.4   Platelets 150 - 399 K/L 224 250 -    CXR: NNF  IMPRESSION:    1) Moderate persistent asthma with acute exacerbation 2) GERD 3) Recurrent cough - most likely due to asthma. Unclear how much GERD is contributing to the cough and to difficulty with asthma control      PLAN:  Continue Advair 230-21 - one inhalation twice a day Continue Singulair 10 mg daily Prednisone 40 mg daily for 5 days, then stop I encouraged more liberal use of albuterol as needed for SOB, cough, chest tightness, wheezing Change famotidine to 40 mg daily taken 1-2 hours prior to bedtime Follow-up in 3-4 months or sooner as needed  Billy Fischer, MD PCCM service Mobile [phone] Pager [phone] 03/22/2017 11:50 AM

## 2017-04-23 ENCOUNTER — Other Ambulatory Visit: Payer: Self-pay | Admitting: Internal Medicine

## 2017-04-29 ENCOUNTER — Encounter: Payer: Self-pay | Admitting: Internal Medicine

## 2017-04-30 NOTE — Telephone Encounter (Signed)
I have not seen her since 10/2016.  Have to have documented that we met and discussed a plan for weight loss, etc.  Please schedule her an appt.  I can see her 05/13/17 at 12:00.

## 2017-05-03 NOTE — Telephone Encounter (Signed)
See if she can come in on 05/12/17 at 12:30.  Thanks

## 2017-05-12 ENCOUNTER — Ambulatory Visit (INDEPENDENT_AMBULATORY_CARE_PROVIDER_SITE_OTHER): Payer: 59 | Admitting: Internal Medicine

## 2017-05-12 ENCOUNTER — Encounter: Payer: Self-pay | Admitting: Internal Medicine

## 2017-05-12 ENCOUNTER — Ambulatory Visit: Payer: 59 | Admitting: Internal Medicine

## 2017-05-12 VITALS — BP 104/68 | HR 68 | Temp 98.2°F | Ht 64.0 in | Wt 190.0 lb

## 2017-05-12 DIAGNOSIS — Z13220 Encounter for screening for lipoid disorders: Secondary | ICD-10-CM

## 2017-05-12 DIAGNOSIS — K2100 Gastro-esophageal reflux disease with esophagitis, without bleeding: Secondary | ICD-10-CM

## 2017-05-12 DIAGNOSIS — F439 Reaction to severe stress, unspecified: Secondary | ICD-10-CM | POA: Diagnosis not present

## 2017-05-12 DIAGNOSIS — N951 Menopausal and female climacteric states: Secondary | ICD-10-CM | POA: Diagnosis not present

## 2017-05-12 DIAGNOSIS — Z6832 Body mass index (BMI) 32.0-32.9, adult: Secondary | ICD-10-CM | POA: Diagnosis not present

## 2017-05-12 MED ORDER — CITALOPRAM HYDROBROMIDE 20 MG PO TABS: 20.0000 mg | ORAL_TABLET | Freq: Every day | ORAL | 3 refills | Status: DC

## 2017-05-12 NOTE — Progress Notes (Signed)
Patient ID: JAIMA JANNEY, female   DOB: 1970/12/02, 46 y.o.   MRN: [id number]   Subjective:    Patient ID: LAJUAN GODBEE, female    DOB: December 17, 1970, 46 y.o.   MRN: [id number]  HPI  Patient here for a scheduled follow up.  Increased stress.  Discussed with her today. She feels she is handling things relatively well.  On celexa and doing well on this dose.  She is frustrated about not losing weight.  Discussed diet and exercise.  Not watching as well what she eats.  No chest pain.  No sob.  No acid reflux.  No abdominal pain.  Bowels moving.  Would like to have [place] checked.     Past Medical History:  Diagnosis Date  . Allergy   . Anxiety   . GERD (gastroesophageal reflux disease)    Past Surgical History:  Procedure Laterality Date  . CHOLECYSTECTOMY  06/08/14  . COLONOSCOPY  04-30-14   Dr Vira Agar  . INNER EAR SURGERY     age 43  . UPPER GI ENDOSCOPY  04-30-14   Dr Vira Agar   Family History  Problem Relation Age of Onset  . Colon polyps Mother   . Hypertension Father   . Breast cancer Neg Hx   . Colon cancer Neg Hx    Social History   Social History  . Marital status: Married    Spouse name: N/A  . Number of children: 2  . 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 Smoking status: Never Smoker  . Smokeless tobacco: Never Used  . Alcohol use 0.0 oz/week     Comment: occasionally  . Drug use: No  . Sexual activity: Not Asked   Other Topics Concern  . None   Social History Narrative  . None    Outpatient Encounter Prescriptions as of 05/12/2017  Medication Sig  . albuterol (PROAIR HFA) 108 (90 Base) MCG/ACT inhaler Inhale 2 puffs into the lungs every 4 (four) hours as needed for wheezing or shortness of breath.  Marland Kitchen albuterol (PROVENTIL) (2.5 MG/3ML) 0.083% nebulizer solution Take 3 mLs (2.5 mg total) by nebulization every 6 (six) hours as needed for wheezing or shortness of breath.  . ALPRAZolam (XANAX) 0.25 MG tablet  Take 1 tablet (0.25 mg total) by mouth daily as needed.  . AMBULATORY NON FORMULARY MEDICATION Medication Name: Incentive Spirometry Use 10-15 times daily.  Marland Kitchen azelastine (ASTELIN) 0.1 % nasal spray Place 1 spray into both nostrils 2 (two) times daily. Use in each nostril as directed  . citalopram (CELEXA) 20 MG tablet Take 1 tablet (20 mg total) by mouth daily.  . famotidine (PEPCID) 40 MG tablet Take 1 tablet (40 mg total) by mouth every evening.  . fluticasone (FLONASE) 50 MCG/ACT nasal spray Place 2 sprays into both nostrils daily.  . fluticasone-salmeterol (ADVAIR HFA) 230-21 MCG/ACT inhaler Inhale 2 puffs into the lungs 2 (two) times daily.  . montelukast (SINGULAIR) 10 MG tablet Take 1 tablet (10 mg total) by mouth at bedtime.  Marland Kitchen Respiratory Therapy Supplies (FLUTTER) DEVI Use 10-15 times daily  . [DISCONTINUED] citalopram (CELEXA) 20 MG tablet TAKE 1 TABLET BY MOUTH  DAILY   No facility-administered encounter medications on file as of 05/12/2017.     Review of Systems  Constitutional: Negative for appetite change and unexpected weight change.  HENT: Negative for congestion and sinus pressure.   Respiratory: Negative for cough, chest  tightness and shortness of breath.   Cardiovascular: Negative for chest pain, palpitations and leg swelling.  Gastrointestinal: Negative for abdominal pain, diarrhea, nausea and vomiting.  Genitourinary: Negative for difficulty urinating and dysuria.  Musculoskeletal: Negative for back pain and joint swelling.  Skin: Negative for color change and rash.  Neurological: Negative for dizziness, light-headedness and headaches.  Psychiatric/Behavioral: Negative for agitation and dysphoric mood.       Objective:    Physical Exam  Constitutional: She appears well-developed and well-nourished. No distress.  HENT:  Nose: Nose normal.  Mouth/Throat: Oropharynx is clear and moist.  Neck: Neck supple. No thyromegaly present.  Cardiovascular: Normal rate and  regular rhythm.   Pulmonary/Chest: Breath sounds normal. No respiratory distress. She has no wheezes.  Abdominal: Soft. Bowel sounds are normal. There is no tenderness.  Musculoskeletal: She exhibits no edema or tenderness.  Lymphadenopathy:    She has no cervical adenopathy.  Skin: No rash noted. No erythema.  Psychiatric: She has a normal mood and affect. Her behavior is normal.    BP 104/68   Pulse 68   Temp 98.2 F (36.8 C) (Oral)   Ht 5' 4"  (1.626 m)   Wt 190 lb (86.2 kg)   SpO2 96%   BMI 32.61 kg/m  Wt Readings from Last 3 Encounters:  05/12/17 190 lb (86.2 kg)  03/22/17 186 lb (84.4 kg)  12/07/16 191 lb (86.6 kg)     Lab Results  Component Value Date   WBC 5.9 05/08/2015   HGB 13.2 05/08/2015   HCT 38 05/08/2015   PLT 224 05/08/2015   GLUCOSE 88 04/23/2014   CHOL 174 05/08/2015   TRIG 137 05/08/2015   HDL 43 05/08/2015   LDLCALC 104 05/08/2015   ALT 42 (A) 05/08/2015   AST 31 05/08/2015   NA 141 05/08/2015   K 4.6 05/08/2015   CL 108 (H) 04/23/2014   CREATININE 0.9 05/08/2015   BUN 14 05/08/2015   CO2 24 04/23/2014   TSH 3.57 05/08/2015    Mm Digital Screening Bilateral  Result Date: 02/16/2017 CLINICAL DATA:  Screening. EXAM: DIGITAL SCREENING BILATERAL MAMMOGRAM WITH CAD COMPARISON:  Previous exam(s). ACR Breast Density Category a: The breast tissue is almost entirely fatty. FINDINGS: There are no findings suspicious for malignancy. Images were processed with CAD. IMPRESSION: No mammographic evidence of malignancy. A result letter of this screening mammogram will be mailed directly to the patient. RECOMMENDATION: Screening mammogram in one year. (Code:SM-B-01Y) BI-RADS CATEGORY  1: Negative. Electronically Signed   By: Margarette Canada M.D.   On: 02/16/2017 13:47       Assessment & Plan:   Problem List Items Addressed This Visit    BMI 32.0-32.9,adult    Discussed diet and exercise.  Information given.  Follow.        Menopausal syndrome    Off  estrogen. Request [place] check.        Relevant Orders   TSH   FSH   Reflux esophagitis    Controlled on current regimen.  Follow.        Relevant Orders   CBC with Differential/Platelet   Hepatic function panel   VITAMIN D 25 Hydroxy (Vit-D Deficiency, Fractures)   Basic metabolic panel   Stress    On citalopram.  Feels this is working well.  Does not feel needs any further intervention.  Follow.         Other Visit Diagnoses    Screening cholesterol level    -  Primary   Relevant Orders   Lipid panel       Einar Pheasant, MD

## 2017-05-13 ENCOUNTER — Ambulatory Visit: Payer: 59 | Admitting: Internal Medicine

## 2017-05-14 ENCOUNTER — Encounter: Payer: Self-pay | Admitting: Internal Medicine

## 2017-05-14 NOTE — Assessment & Plan Note (Signed)
On citalopram.  Feels this is working well.  Does not feel needs any further intervention.  Follow.

## 2017-05-14 NOTE — Assessment & Plan Note (Signed)
Controlled on current regimen.  Follow.  

## 2017-05-14 NOTE — Assessment & Plan Note (Signed)
Off estrogen. Request [place] check.

## 2017-05-14 NOTE — Assessment & Plan Note (Signed)
Discussed diet and exercise.  Information given.  Follow.

## 2017-05-25 ENCOUNTER — Encounter: Payer: Self-pay | Admitting: Obstetrics and Gynecology

## 2017-05-25 LAB — CBC WITH DIFFERENTIAL/PLATELET
Basophils Absolute: 0 10*3/uL (ref 0.0–0.2)
Basos: 1 %
EOS (ABSOLUTE): 0.3 10*3/uL (ref 0.0–0.4)
Eos: 6 %
Hematocrit: 39.4 % (ref 34.0–46.6)
Hemoglobin: 13.3 g/dL (ref 11.1–15.9)
Immature Grans (Abs): 0 10*3/uL (ref 0.0–0.1)
Immature Granulocytes: 0 %
Lymphocytes Absolute: 1.9 10*3/uL (ref 0.7–3.1)
Lymphs: 36 %
MCH: 27.8 pg (ref 26.6–33.0)
MCHC: 33.8 g/dL (ref 31.5–35.7)
MCV: 82 fL (ref 79–97)
Monocytes Absolute: 0.3 10*3/uL (ref 0.1–0.9)
Monocytes: 6 %
Neutrophils Absolute: 2.7 10*3/uL (ref 1.4–7.0)
Neutrophils: 51 %
Platelets: 216 10*3/uL (ref 150–379)
RBC: 4.79 x10E6/uL (ref 3.77–5.28)
RDW: 13.5 % (ref 12.3–15.4)
WBC: 5.2 10*3/uL (ref 3.4–10.8)

## 2017-05-25 LAB — LIPID PANEL
Chol/HDL Ratio: 4.1 ratio (ref 0.0–4.4)
Cholesterol, Total: 187 mg/dL (ref 100–199)
HDL: 46 mg/dL (ref >39)
LDL Calculated: 120 mg/dL — ABNORMAL HIGH (ref 0–99)
Triglycerides: 104 mg/dL (ref 0–149)
VLDL Cholesterol Cal: 21 mg/dL (ref 5–40)

## 2017-05-25 LAB — BASIC METABOLIC PANEL
BUN/Creatinine Ratio: 22 (ref 9–23)
BUN: 18 mg/dL (ref 6–24)
CO2: 22 mmol/L (ref 20–29)
Calcium: 9.8 mg/dL (ref 8.7–10.2)
Chloride: 105 mmol/L (ref 96–106)
Creatinine, Ser: 0.82 mg/dL (ref 0.57–1.00)
GFR calc Af Amer: 99 mL/min/{1.73_m2} (ref >59)
GFR calc non Af Amer: 85 mL/min/{1.73_m2} (ref >59)
Glucose: 103 mg/dL — ABNORMAL HIGH (ref 65–99)
Potassium: 4.7 mmol/L (ref 3.5–5.2)
Sodium: 142 mmol/L (ref 134–144)

## 2017-05-25 LAB — HEPATIC FUNCTION PANEL
ALT: 40 IU/L — ABNORMAL HIGH (ref 0–32)
AST: 26 IU/L (ref 0–40)
Albumin: 4.5 g/dL (ref 3.5–5.5)
Alkaline Phosphatase: 90 IU/L (ref 39–117)
Bilirubin Total: 0.6 mg/dL (ref 0.0–1.2)
Bilirubin, Direct: 0.15 mg/dL (ref 0.00–0.40)
Total Protein: 6.7 g/dL (ref 6.0–8.5)

## 2017-05-25 LAB — TSH: TSH: 3.4 u[IU]/mL (ref 0.450–4.500)

## 2017-05-25 LAB — VITAMIN D 25 HYDROXY (VIT D DEFICIENCY, FRACTURES): Vit D, 25-Hydroxy: 38.4 ng/mL (ref 30.0–100.0)

## 2017-05-25 LAB — FOLLICLE STIMULATING HORMONE: FSH: 75.8 m[IU]/mL

## 2017-06-02 ENCOUNTER — Ambulatory Visit (INDEPENDENT_AMBULATORY_CARE_PROVIDER_SITE_OTHER): Payer: 59 | Admitting: Obstetrics and Gynecology

## 2017-06-02 ENCOUNTER — Encounter: Payer: Self-pay | Admitting: Obstetrics and Gynecology

## 2017-06-02 VITALS — BP 106/77 | HR 72 | Ht 63.0 in | Wt 188.7 lb

## 2017-06-02 DIAGNOSIS — Z30432 Encounter for removal of intrauterine contraceptive device: Secondary | ICD-10-CM

## 2017-06-02 DIAGNOSIS — R102 Pelvic and perineal pain: Secondary | ICD-10-CM | POA: Diagnosis not present

## 2017-06-02 DIAGNOSIS — E669 Obesity, unspecified: Secondary | ICD-10-CM

## 2017-06-02 LAB — POCT URINALYSIS DIPSTICK
Bilirubin, UA: NEGATIVE
Blood, UA: NEGATIVE
Glucose, UA: NEGATIVE
Ketones, UA: NEGATIVE
Leukocytes, UA: NEGATIVE
Nitrite, UA: NEGATIVE
Protein, UA: NEGATIVE
Spec Grav, UA: 1.01 (ref 1.010–1.025)
Urobilinogen, UA: 0.2 E.U./dL
pH, UA: 6.5 (ref 5.0–8.0)

## 2017-06-02 MED ORDER — PHENTERMINE HCL 37.5 MG PO TABS: 37.5000 mg | ORAL_TABLET | Freq: Every day | ORAL | 2 refills | Status: DC

## 2017-06-02 MED ORDER — CYANOCOBALAMIN 1000 MCG/ML IJ SOLN: 1000.0000 ug | INTRAMUSCULAR | 1 refills | Status: DC

## 2017-06-02 NOTE — Progress Notes (Signed)
Subjective:     Patient ID: Tasha Woods, female   DOB: July 08, 1971, 46 y.o.   MRN: [id number]  HPI Reports onset RLQ pain about 4-5 weeks ago. Never had this pain before, denies changes in BM or urination. Does report mild pain in RLQ with sex and has had 2 episode of spotting after sex. Denies fever or diarrhea. States pain is intermittent and stabbing in nature.  Has Mirena in place and we reviewed labs obtained by PCP 2 weeks ago that showed she was in menopausal Reports daily hot flashes and night sweats, increased irritability, decreased sleep and weight gain.   Review of Systems Negative except stated above in HPI    Objective:   Physical Exam A&Ox4 Well groomed female in no acute distress Blood pressure 106/77, pulse 72, height 5' 3"  (1.6 m), weight 188 lb 11.2 oz (85.6 kg). Body mass index is 33.43 kg/m.  Abdomen soft and mildly tender in RLQ Pelvic exam: VAGINA: normal appearing vagina with normal color and discharge, no lesions, CERVIX: normal appearing cervix without discharge or lesions, multiparous os, IUS string noted and grasp with sponge clamp- easily removed, UTERUS: uterus is normal size, shape, consistency and nontender, ADNEXA: tenderness right, with sliht fullness noted.    Assessment:     RLQ pain Menopausal Obesity IUD removal    Plan:     Discussed common causes of pelvic pain in menopauual women, including UTI, IUD positioning, ovarian cyst, constipation, and weight gain in abdomen. Will treat with 7 weeks of LoLoEstrin to suppress ovaries, and f/u with u/s in 8 weeks if not improved. IUD removed without difficulty.  Discussed at length weight loss program here and she desires starting it. B12 injection given today, will RTC in 4 weeks for nurse visit to check weight/bp and give next B12 injection.  Abdoulie Tierce [place], CNM

## 2017-06-23 ENCOUNTER — Telehealth: Payer: Self-pay | Admitting: Obstetrics and Gynecology

## 2017-06-23 ENCOUNTER — Encounter: Payer: Self-pay | Admitting: Internal Medicine

## 2017-06-23 NOTE — Telephone Encounter (Signed)
Patient called and stated that she would like to have Amy call her back in regards to her possibly changing her insurance and how it would effect her coverage. No other information was disclosed. Please advise.

## 2017-06-24 NOTE — Telephone Encounter (Signed)
Lm on pt vm to schedule these appts and get info

## 2017-06-24 NOTE — Telephone Encounter (Signed)
I am ok taking these pts.  Will need to schedule establish care appts.

## 2017-06-28 ENCOUNTER — Ambulatory Visit (INDEPENDENT_AMBULATORY_CARE_PROVIDER_SITE_OTHER): Payer: 59 | Admitting: Obstetrics and Gynecology

## 2017-06-28 ENCOUNTER — Encounter: Payer: Self-pay | Admitting: Obstetrics and Gynecology

## 2017-06-28 VITALS — BP 118/82 | HR 75 | Wt 188.0 lb

## 2017-06-28 DIAGNOSIS — E663 Overweight: Secondary | ICD-10-CM | POA: Diagnosis not present

## 2017-06-28 MED ORDER — CYANOCOBALAMIN 1000 MCG/ML IJ SOLN
1000.0000 ug | Freq: Once | INTRAMUSCULAR | Status: AC
  Administered 2017-06-28: 1000 ug via INTRAMUSCULAR

## 2017-06-28 NOTE — Progress Notes (Signed)
Pt is here for wt, bp check, b-12 inj Denies any s/e, has been on vacation with some friends in [place], hasnt really been following a diet  06/28/17 wt- 188lb 06/02/17 wt- 188lb

## 2017-07-02 ENCOUNTER — Other Ambulatory Visit: Payer: Self-pay | Admitting: Internal Medicine

## 2017-07-26 ENCOUNTER — Ambulatory Visit: Payer: 59 | Admitting: Obstetrics and Gynecology

## 2017-07-26 ENCOUNTER — Other Ambulatory Visit: Payer: 59

## 2017-08-17 ENCOUNTER — Ambulatory Visit (INDEPENDENT_AMBULATORY_CARE_PROVIDER_SITE_OTHER): Payer: 59 | Admitting: Internal Medicine

## 2017-08-17 ENCOUNTER — Encounter: Payer: Self-pay | Admitting: Internal Medicine

## 2017-08-17 VITALS — BP 110/70 | HR 74 | Temp 98.1°F | Ht 63.0 in | Wt 188.0 lb

## 2017-08-17 DIAGNOSIS — F439 Reaction to severe stress, unspecified: Secondary | ICD-10-CM | POA: Diagnosis not present

## 2017-08-17 DIAGNOSIS — Z0000 Encounter for general adult medical examination without abnormal findings: Secondary | ICD-10-CM | POA: Diagnosis not present

## 2017-08-17 DIAGNOSIS — K2100 Gastro-esophageal reflux disease with esophagitis, without bleeding: Secondary | ICD-10-CM

## 2017-08-17 MED ORDER — FAMOTIDINE 40 MG PO TABS: 40.0000 mg | ORAL_TABLET | Freq: Every evening | ORAL | 1 refills | Status: DC

## 2017-08-17 NOTE — Progress Notes (Signed)
Patient ID: Tasha Woods, female   DOB: 07/05/1971, 46 y.o.   MRN: [id number]   Subjective:    Patient ID: Tasha Woods, female    DOB: 1971/02/12, 46 y.o.   MRN: [id number]  HPI  Patient here for her physical exam.  States she is doing relatively well.  No chest pain.  No sob.  No acid reflux.  No bowel change.  Handling stress.  No pelvic pain.  Saw gyn.  IUD removed.  Started B12.  Discussed weight loss.    Past Medical History:  Diagnosis Date  . Allergy   . Anxiety   . GERD (gastroesophageal reflux disease)    Past Surgical History:  Procedure Laterality Date  . CHOLECYSTECTOMY  06/08/14  . COLONOSCOPY  04-30-14   Dr Vira Agar  . INNER EAR SURGERY     age 23  . UPPER GI ENDOSCOPY  04-30-14   Dr Vira Agar   Family History  Problem Relation Age of Onset  . Colon polyps Mother   . Hypertension Father   . Breast cancer Neg Hx   . Colon cancer Neg Hx    Social History   Socioeconomic History  . Marital status: Married    Spouse name: None  . Number of children: 2  . Years of education: None  . Highest education level: None  Social Needs  . Financial resource strain: None  . Food insecurity - worry: None  . Food insecurity - inability: None  . Transportation needs - medical: None  . Transportation needs - non-medical: None  Occupational History    Employer: [employer]  Tobacco Use  . Smoking status: Never Smoker  . Smokeless tobacco: Never Used  Substance and Sexual Activity  . Alcohol use: Yes    Alcohol/week: 0.0 oz    Comment: occasionally  . Drug use: No  . Sexual activity: Yes    Birth control/protection: IUD    Comment: mirena  Other Topics Concern  . None  Social History Narrative  . None    Outpatient Encounter Medications as of 08/17/2017  Medication Sig  . albuterol (PROAIR HFA) 108 (90 Base) MCG/ACT inhaler Inhale 2 puffs into the lungs every 4 (four) hours as needed for wheezing or shortness of breath.  . citalopram (CELEXA) 20 MG tablet Take 1  tablet (20 mg total) by mouth daily.  . cyanocobalamin (,VITAMIN B-12,) 1000 MCG/ML injection Inject 1 mL (1,000 mcg total) into the muscle every 30 (thirty) days.  . famotidine (PEPCID) 40 MG tablet Take 1 tablet (40 mg total) by mouth every evening.  . fluticasone (FLONASE) 50 MCG/ACT nasal spray USE 2 SPRAYS IN EACH  NOSTRIL DAILY  . montelukast (SINGULAIR) 10 MG tablet Take 1 tablet (10 mg total) by mouth at bedtime.  Marland Kitchen Respiratory Therapy Supplies (FLUTTER) DEVI Use 10-15 times daily  . [DISCONTINUED] famotidine (PEPCID) 40 MG tablet Take 1 tablet (40 mg total) by mouth every evening.  . [DISCONTINUED] phentermine (ADIPEX-P) 37.5 MG tablet Take 1 tablet (37.5 mg total) by mouth daily before breakfast.   No facility-administered encounter medications on file as of 08/17/2017.     Review of Systems  Constitutional: Negative for appetite change and unexpected weight change.  HENT: Negative for congestion and sinus pressure.   Eyes: Negative for pain and visual disturbance.  Respiratory: Negative for cough, chest tightness and shortness of breath.   Cardiovascular: Negative for chest pain, palpitations and leg swelling.  Gastrointestinal: Negative for abdominal pain, diarrhea, nausea and  vomiting.  Genitourinary: Negative for difficulty urinating and dysuria.  Musculoskeletal: Negative for back pain and joint swelling.  Skin: Negative for color change and rash.  Neurological: Negative for dizziness, light-headedness and headaches.  Hematological: Negative for adenopathy. Does not bruise/bleed easily.  Psychiatric/Behavioral: Negative for agitation and dysphoric mood.       Objective:    Physical Exam  Constitutional: She is oriented to person, place, and time. She appears well-developed and well-nourished. No distress.  HENT:  Nose: Nose normal.  Mouth/Throat: Oropharynx is clear and moist.  Eyes: Right eye exhibits no discharge. Left eye exhibits no discharge. No scleral icterus.   Neck: Neck supple. No thyromegaly present.  Cardiovascular: Normal rate and regular rhythm.  Pulmonary/Chest: Breath sounds normal. No accessory muscle usage. No tachypnea. No respiratory distress. She has no decreased breath sounds. She has no wheezes. She has no rhonchi. Right breast exhibits no inverted nipple, no mass, no nipple discharge and no tenderness (no axillary adenopathy). Left breast exhibits no inverted nipple, no mass, no nipple discharge and no tenderness (no axilarry adenopathy).  Abdominal: Soft. Bowel sounds are normal. There is no tenderness.  Musculoskeletal: She exhibits no edema or tenderness.  Lymphadenopathy:    She has no cervical adenopathy.  Neurological: She is alert and oriented to person, place, and time.  Skin: Skin is warm. No rash noted. No erythema.  Psychiatric: She has a normal mood and affect. Her behavior is normal.    BP 110/70   Pulse 74   Temp 98.1 F (36.7 C) (Oral)   Ht 5' 3"  (1.6 m)   Wt 188 lb (85.3 kg)   SpO2 96%   BMI 33.30 kg/m  Wt Readings from Last 3 Encounters:  08/17/17 188 lb (85.3 kg)  06/28/17 188 lb (85.3 kg)  06/02/17 188 lb 11.2 oz (85.6 kg)     Lab Results  Component Value Date   WBC 5.2 05/24/2017   HGB 13.3 05/24/2017   HCT 39.4 05/24/2017   PLT 216 05/24/2017   GLUCOSE 103 (H) 05/24/2017   CHOL 187 05/24/2017   TRIG 104 05/24/2017   HDL 46 05/24/2017   LDLCALC 120 (H) 05/24/2017   ALT 40 (H) 05/24/2017   AST 26 05/24/2017   NA 142 05/24/2017   K 4.7 05/24/2017   CL 105 05/24/2017   CREATININE 0.82 05/24/2017   BUN 18 05/24/2017   CO2 22 05/24/2017   TSH 3.400 05/24/2017    Mm Digital Screening Bilateral  Result Date: 02/16/2017 CLINICAL DATA:  Screening. EXAM: DIGITAL SCREENING BILATERAL MAMMOGRAM WITH CAD COMPARISON:  Previous exam(s). ACR Breast Density Category a: The breast tissue is almost entirely fatty. FINDINGS: There are no findings suspicious for malignancy. Images were processed with CAD.  IMPRESSION: No mammographic evidence of malignancy. A result letter of this screening mammogram will be mailed directly to the patient. RECOMMENDATION: Screening mammogram in one year. (Code:SM-B-01Y) BI-RADS CATEGORY  1: Negative. Electronically Signed   By: Margarette Canada M.D.   On: 02/16/2017 13:47       Assessment & Plan:   Problem List Items Addressed This Visit    Health care maintenance    Physical today 08/17/18.  Mammogram 02/16/17 - Birads I.  Colonoscopy 03/2014.  Recommended f/u in 04/2019.  PAP 04/16/16 - negative with negative HPV.  Discussed pap with her today.  Wants to repeat pap next year.        Reflux esophagitis    Controlled on current regimen.  Follow.  Stress    Handling stress.  Follow.  Doing well on citalopram.           Einar Pheasant, MD

## 2017-08-17 NOTE — Progress Notes (Signed)
Pre visit review using our clinic review tool, if applicable. No additional management support is needed unless otherwise documented below in the visit note. 

## 2017-08-20 ENCOUNTER — Encounter: Payer: Self-pay | Admitting: Internal Medicine

## 2017-08-20 NOTE — Assessment & Plan Note (Signed)
Handling stress.  Follow.  Doing well on citalopram.

## 2017-08-20 NOTE — Assessment & Plan Note (Signed)
Controlled on current regimen.  Follow.  

## 2017-08-20 NOTE — Assessment & Plan Note (Signed)
Physical today 08/17/18.  Mammogram 02/16/17 - Birads I.  Colonoscopy 03/2014.  Recommended f/u in 04/2019.  PAP 04/16/16 - negative with negative HPV.  Discussed pap with her today.  Wants to repeat pap next year.

## 2017-09-07 ENCOUNTER — Telehealth: Payer: Self-pay | Admitting: Internal Medicine

## 2017-09-07 NOTE — Telephone Encounter (Signed)
I can see her on 09/10/17 at 1:30 - if ok to wait until this appt.  (this Friday).  Let me know if a problem.

## 2017-09-07 NOTE — Telephone Encounter (Signed)
Please advise 
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
## 2017-09-07 NOTE — Telephone Encounter (Signed)
Pt scheduled  

## 2017-09-10 ENCOUNTER — Encounter: Payer: Self-pay | Admitting: Internal Medicine

## 2017-09-10 ENCOUNTER — Ambulatory Visit: Payer: Managed Care, Other (non HMO) | Admitting: Internal Medicine

## 2017-09-10 DIAGNOSIS — S060X0D Concussion without loss of consciousness, subsequent encounter: Secondary | ICD-10-CM
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
## 2017-09-10 NOTE — Patient Instructions (Signed)
Call with update as we discussed.

## 2017-09-12 ENCOUNTER — Encounter: Payer: Self-pay | Admitting: Internal Medicine

## 2017-09-12 DIAGNOSIS — S060XAA Concussion with loss of consciousness status unknown, initial encounter: Secondary | ICD-10-CM | POA: Insufficient documentation

## 2017-09-12 DIAGNOSIS — S060X9A Concussion with loss of consciousness of unspecified duration, initial encounter: Secondary | ICD-10-CM | POA: Insufficient documentation
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
## 2017-09-13 ENCOUNTER — Encounter: Payer: Self-pay | Admitting: Internal Medicine

## 2017-11-02 ENCOUNTER — Other Ambulatory Visit: Payer: Self-pay | Admitting: Pulmonary Disease

## 2017-11-02 NOTE — Telephone Encounter (Signed)
90 day rx authorized with no additional refill. Note to pharmacy patient needs appointment before future refill.

## 2017-12-21 ENCOUNTER — Other Ambulatory Visit: Payer: Self-pay | Admitting: Pulmonary Disease

## 2017-12-26 ENCOUNTER — Other Ambulatory Visit: Payer: Self-pay | Admitting: Pulmonary Disease

## 2018-02-07 ENCOUNTER — Other Ambulatory Visit: Payer: Self-pay | Admitting: Internal Medicine

## 2018-02-21 ENCOUNTER — Ambulatory Visit: Payer: 59 | Admitting: Internal Medicine

## 2018-02-21 DIAGNOSIS — Z0289 Encounter for other administrative examinations: Secondary | ICD-10-CM

## 2018-03-24 ENCOUNTER — Ambulatory Visit: Payer: Managed Care, Other (non HMO) | Admitting: Internal Medicine

## 2018-03-24 VITALS — BP 106/70 | HR 71 | Temp 97.9°F | Resp 18 | Wt 193.4 lb

## 2018-03-24 DIAGNOSIS — Z9109 Other allergy status, other than to drugs and biological substances: Secondary | ICD-10-CM

## 2018-03-24 DIAGNOSIS — Z1231 Encounter for screening mammogram for malignant neoplasm of breast: Secondary | ICD-10-CM

## 2018-03-24 DIAGNOSIS — Z1239 Encounter for other screening for malignant neoplasm of breast: Secondary | ICD-10-CM

## 2018-03-24 DIAGNOSIS — F439 Reaction to severe stress, unspecified: Secondary | ICD-10-CM

## 2018-03-24 MED ORDER — FLUTICASONE-SALMETEROL 230-21 MCG/ACT IN AERO: INHALATION_SPRAY | RESPIRATORY_TRACT | 3 refills | Status: DC

## 2018-03-24 MED ORDER — MONTELUKAST SODIUM 10 MG PO TABS: 10.0000 mg | ORAL_TABLET | Freq: Every day | ORAL | 3 refills | Status: DC

## 2018-03-24 NOTE — Progress Notes (Signed)
Patient ID: Tasha Woods, female   DOB: 09-11-1970, 47 y.o.   MRN: [id number]   Subjective:    Patient ID: Tasha Woods, female    DOB: [birth year], 47 y.o.   MRN: [id number]  HPI  Patient here for a scheduled follow up.  She reports she is doing well.  Feels good.  Concerned regarding her weight.  Discussed diet and exercise.  No chest pain.  No sob.  No acid reflux.  No abdominal pain.  Bowels moving.  No urine change.  Handling stress.     Past Medical History:  Diagnosis Date  . Allergy   . Anxiety   . GERD (gastroesophageal reflux disease)    Past Surgical History:  Procedure Laterality Date  . CHOLECYSTECTOMY  06/08/14  . COLONOSCOPY  04-30-14   Dr Vira Agar  . INNER EAR SURGERY     age 72  . UPPER GI ENDOSCOPY  04-30-14   Dr Vira Agar   Family History  Problem Relation Age of Onset  . Colon polyps Mother   . Hypertension Father   . Breast cancer Neg Hx   . Colon cancer Neg Hx    Social History   Socioeconomic History  . Marital status: Married    Spouse name: Not on file  . Number of children: 2  . Years of education: Not on file  . Highest education level: Not on file  Occupational History    Employer: [employer]  . Financial resource strain: Not on file  . Food insecurity:    Worry: Not on file    Inability: Not on file  . Transportation needs:    Medical: Not on file    Non-medical: Not on file  Tobacco Use  . Smoking status: Never Smoker  . Smokeless tobacco: Never Used  Substance and Sexual Activity  . Alcohol use: Yes    Alcohol/week: 0.0 oz    Comment: occasionally  . Drug use: No  . Sexual activity: Yes    Birth control/protection: IUD    Comment: mirena  Lifestyle  . Physical activity:    Days per week: Not on file    Minutes per session: Not on file  . Stress: Not on file  Relationships  . Social connections:    Talks on phone: Not on file    Gets together: Not on file    Attends religious service: Not on file    Active  member of club or organization: Not on file    Attends meetings of clubs or organizations: Not on file    Relationship status: Not on file  Other Topics Concern  . Not on file  Social History Narrative  . Not on file    Outpatient Encounter Medications as of 03/24/2018  Medication Sig  . albuterol (PROAIR HFA) 108 (90 Base) MCG/ACT inhaler Inhale 2 puffs into the lungs every 4 (four) hours as needed for wheezing or shortness of breath.  . Celecoxib (CELEBREX PO) Take by mouth.  . citalopram (CELEXA) 20 MG tablet Take 1 tablet (20 mg total) by mouth daily.  . cyanocobalamin (,VITAMIN B-12,) 1000 MCG/ML injection Inject 1 mL (1,000 mcg total) into the muscle every 30 (thirty) days.  . famotidine (PEPCID) 40 MG tablet Take 1 tablet (40 mg total) by mouth every evening.  . fluticasone (FLONASE) 50 MCG/ACT nasal spray USE 2 SPRAYS IN EACH  NOSTRIL DAILY  . fluticasone (FLONASE) 50 MCG/ACT nasal spray USE 2 SPRAYS IN [facility]  NOSTRIL DAILY  . fluticasone-salmeterol (ADVAIR HFA) 230-21 MCG/ACT inhaler USE 2 PUFFS TWO TIMES DAILY  . montelukast (SINGULAIR) 10 MG tablet Take 1 tablet (10 mg total) by mouth at bedtime.  . naproxen sodium (ALEVE) 220 MG tablet Take 220 mg by mouth.  . Respiratory Therapy Supplies (FLUTTER) DEVI Use 10-15 times daily  . [DISCONTINUED] ADVAIR HFA 230-21 MCG/ACT inhaler USE 2 PUFFS TWO TIMES DAILY  . [DISCONTINUED] montelukast (SINGULAIR) 10 MG tablet TAKE 1 TABLET BY MOUTH AT  BEDTIME   No facility-administered encounter medications on file as of 03/24/2018.     Review of Systems  Constitutional: Negative for appetite change and unexpected weight change.  HENT: Negative for congestion and sinus pressure.   Respiratory: Negative for cough, chest tightness and shortness of breath.   Cardiovascular: Negative for chest pain, palpitations and leg swelling.  Gastrointestinal: Negative for abdominal pain, diarrhea, nausea and vomiting.  Genitourinary: Negative for  difficulty urinating and dysuria.  Musculoskeletal: Negative for joint swelling and myalgias.  Skin: Negative for color change and rash.  Neurological: Negative for dizziness, light-headedness and headaches.  Psychiatric/Behavioral: Negative for agitation and dysphoric mood.       Objective:    Physical Exam  Constitutional: She appears well-developed and well-nourished. No distress.  HENT:  Nose: Nose normal.  Mouth/Throat: Oropharynx is clear and moist.  Neck: Neck supple. No thyromegaly present.  Cardiovascular: Normal rate and regular rhythm.  Pulmonary/Chest: Breath sounds normal. No respiratory distress. She has no wheezes.  Abdominal: Soft. Bowel sounds are normal. There is no tenderness.  Musculoskeletal: She exhibits no edema or tenderness.  Lymphadenopathy:    She has no cervical adenopathy.  Skin: No rash noted. No erythema.  Psychiatric: She has a normal mood and affect. Her behavior is normal.    BP 106/70 (BP Location: Left Arm, Patient Position: Sitting, Cuff Size: Normal)   Pulse 71   Temp 97.9 F (36.6 C) (Oral)   Resp 18   Wt 193 lb 6.4 oz (87.7 kg)   SpO2 97%   BMI 34.26 kg/m  Wt Readings from Last 3 Encounters:  03/24/18 193 lb 6.4 oz (87.7 kg)  09/10/17 189 lb (85.7 kg)  08/17/17 188 lb (85.3 kg)     Lab Results  Component Value Date   WBC 5.2 05/24/2017   HGB 13.3 05/24/2017   HCT 39.4 05/24/2017   PLT 216 05/24/2017   GLUCOSE 103 (H) 05/24/2017   CHOL 187 05/24/2017   TRIG 104 05/24/2017   HDL 46 05/24/2017   LDLCALC 120 (H) 05/24/2017   ALT 40 (H) 05/24/2017   AST 26 05/24/2017   NA 142 05/24/2017   K 4.7 05/24/2017   CL 105 05/24/2017   CREATININE 0.82 05/24/2017   BUN 18 05/24/2017   CO2 22 05/24/2017   TSH 3.400 05/24/2017    Mm Digital Screening Bilateral  Result Date: 02/16/2017 CLINICAL DATA:  Screening. EXAM: DIGITAL SCREENING BILATERAL MAMMOGRAM WITH CAD COMPARISON:  Previous exam(s). ACR Breast Density Category a: The  breast tissue is almost entirely fatty. FINDINGS: There are no findings suspicious for malignancy. Images were processed with CAD. IMPRESSION: No mammographic evidence of malignancy. A result letter of this screening mammogram will be mailed directly to the patient. RECOMMENDATION: Screening mammogram in one year. (Code:SM-B-01Y) BI-RADS CATEGORY  1: Negative. Electronically Signed   By: Margarette Canada M.D.   On: 02/16/2017 13:47       Assessment & Plan:   Problem List Items Addressed This Visit  Environmental allergies    Controlled.        Stress    Overall doing well on citalopram.  Wants to taper off.  Tapering instructions given.  Follow.  Call with update over the next 3-4 weeks.         Other Visit Diagnoses    Breast cancer screening    -  Primary   Relevant Orders   MM 3D SCREEN BREAST BILATERAL       Einar Pheasant, MD

## 2018-03-26 ENCOUNTER — Encounter: Payer: Self-pay | Admitting: Internal Medicine

## 2018-03-26 NOTE — Assessment & Plan Note (Signed)
Overall doing well on citalopram.  Wants to taper off.  Tapering instructions given.  Follow.  Call with update over the next 3-4 weeks.

## 2018-03-26 NOTE — Assessment & Plan Note (Signed)
Controlled.  

## 2018-03-28 ENCOUNTER — Other Ambulatory Visit: Payer: Self-pay | Admitting: Internal Medicine

## 2018-04-13 ENCOUNTER — Encounter: Payer: Self-pay | Admitting: Radiology

## 2018-04-13 ENCOUNTER — Ambulatory Visit
Admission: RE | Admit: 2018-04-13 | Discharge: 2018-04-13 | Disposition: A | Discharge status: Home / Self Care | Payer: Managed Care, Other (non HMO) | Source: Ambulatory Visit | Attending: Internal Medicine | Admitting: Internal Medicine

## 2018-04-13 DIAGNOSIS — Z1231 Encounter for screening mammogram for malignant neoplasm of breast: Secondary | ICD-10-CM | POA: Diagnosis present

## 2018-04-13 DIAGNOSIS — Z1239 Encounter for other screening for malignant neoplasm of breast: Secondary | ICD-10-CM

## 2018-04-20 ENCOUNTER — Encounter: Payer: Self-pay | Admitting: Internal Medicine

## 2018-05-19 ENCOUNTER — Encounter: Payer: Self-pay | Admitting: Internal Medicine

## 2018-05-19 NOTE — Telephone Encounter (Signed)
Please print the form on this pt.

## 2018-05-20 NOTE — Telephone Encounter (Signed)
Form completed.  Placed in box.

## 2018-05-20 NOTE — Telephone Encounter (Signed)
I have printed the form and filled out our information, placed in quick sign for your signature

## 2018-05-26 ENCOUNTER — Other Ambulatory Visit: Payer: Self-pay | Admitting: Internal Medicine

## 2018-06-29 ENCOUNTER — Ambulatory Visit (INDEPENDENT_AMBULATORY_CARE_PROVIDER_SITE_OTHER): Payer: Managed Care, Other (non HMO)

## 2018-06-29 ENCOUNTER — Ambulatory Visit: Payer: Managed Care, Other (non HMO) | Admitting: Podiatry

## 2018-06-29 ENCOUNTER — Encounter: Payer: Self-pay | Admitting: Podiatry

## 2018-06-29 VITALS — BP 120/72 | HR 82 | Resp 16

## 2018-06-29 DIAGNOSIS — M7750 Other enthesopathy of unspecified foot: Secondary | ICD-10-CM

## 2018-06-29 DIAGNOSIS — M7751 Other enthesopathy of right foot: Secondary | ICD-10-CM | POA: Diagnosis not present

## 2018-06-29 DIAGNOSIS — M722 Plantar fascial fibromatosis: Secondary | ICD-10-CM | POA: Diagnosis not present

## 2018-06-29 MED ORDER — METHYLPREDNISOLONE 4 MG PO TBPK: ORAL_TABLET | ORAL | 0 refills | Status: DC

## 2018-06-29 MED ORDER — MELOXICAM 15 MG PO TABS: 15.0000 mg | ORAL_TABLET | Freq: Every day | ORAL | 3 refills | Status: DC

## 2018-06-29 NOTE — Progress Notes (Signed)
Subjective:  Patient ID: Tasha Woods, female    DOB: 13-Sep-1970,  MRN: [id number] HPI Chief Complaint  Patient presents with  . Foot Pain    Lateral leg bilateral (R>L) - sharp, burning, numbness if walking for long periods of time x 3 years, also had dorsal midfoot numbness while in [place] this past weekend, tried Naproxen and ice-no help  . New Patient (Initial Visit)    47 y.o. female presents with the above complaint.   ROS: Denies fever chills nausea vomiting muscle aches pains calf pain back pain chest pain shortness of breath.  Past Medical History:  Diagnosis Date  . Allergy   . Anxiety   . GERD (gastroesophageal reflux disease)    Past Surgical History:  Procedure Laterality Date  . CHOLECYSTECTOMY  06/08/14  . COLONOSCOPY  04-30-14   Dr Vira Agar  . INNER EAR SURGERY     age 87  . UPPER GI ENDOSCOPY  04-30-14   Dr Vira Agar    Current Outpatient Medications:  .  escitalopram (LEXAPRO) 20 MG tablet, Take by mouth., Disp: , Rfl:  .  famotidine (PEPCID) 40 MG tablet, TAKE 1 TABLET BY MOUTH  EVERY EVENING, Disp: 90 tablet, Rfl: 1 .  fluticasone (FLONASE) 50 MCG/ACT nasal spray, USE 2 SPRAYS IN EACH  NOSTRIL DAILY, Disp: 48 g, Rfl: 1 .  fluticasone-salmeterol (ADVAIR HFA) 230-21 MCG/ACT inhaler, USE 2 PUFFS TWO TIMES DAILY, Disp: 36 g, Rfl: 3 .  meloxicam (MOBIC) 15 MG tablet, Take 1 tablet (15 mg total) by mouth daily., Disp: 30 tablet, Rfl: 3 .  methylPREDNISolone (MEDROL DOSEPAK) 4 MG TBPK tablet, 6 day dose pack - take as directed, Disp: 21 tablet, Rfl: 0 .  montelukast (SINGULAIR) 10 MG tablet, Take 1 tablet (10 mg total) by mouth at bedtime., Disp: 90 tablet, Rfl: 3 .  naproxen sodium (ALEVE) 220 MG tablet, Take 220 mg by mouth., Disp: , Rfl:  .  Respiratory Therapy Supplies (FLUTTER) DEVI, Use 10-15 times daily, Disp: 1 each, Rfl: 0  Allergies  Allergen Reactions  . Ceftin [Cefuroxime Axetil] Rash  . Erythromycin Rash  . Penicillins Rash  . Sulfa Antibiotics  Rash   Review of Systems Objective:   Vitals:   06/29/18 1017  BP: 120/72  Pulse: 82  Resp: 16    General: Well developed, nourished, in no acute distress, alert and oriented x3   Dermatological: Skin is warm, dry and supple bilateral. Nails x 10 are well maintained; remaining integument appears unremarkable at this time. There are no open sores, no preulcerative lesions, no rash or signs of infection present.  Vascular: Dorsalis Pedis artery and Posterior Tibial artery pedal pulses are 2/4 bilateral with immedate capillary fill time. Pedal hair growth present. No varicosities and no lower extremity edema present bilateral.   Neruologic: Grossly intact via light touch bilateral. Vibratory intact via tuning fork bilateral. Protective threshold with Semmes Wienstein monofilament intact to all pedal sites bilateral. Patellar and Achilles deep tendon reflexes 2+ bilateral. No Babinski or clonus noted bilateral.  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   Musculoskeletal: No gross boney pedal deformities bilateral. No pain, crepitus, or limitation noted with foot and ankle range of motion bilateral. Muscular strength 5/5 in all groups tested bilateral.  She has pain on palpation of the peroneal tendons cavus foot deformity with supinated foot type.  She has an uncompensated rear foot varus she also has  tenderness on palpation of the common peroneal nerve right leg greater than the left  Gait: Unassisted, Nonantalgic.    Radiographs:  Radiographs taken today demonstrate an osseously mature individual no major osseous abnormalities.  Soft tissue increase in density plantar calcaneal insertion sites of bilateral heels. 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  Inflammation of the peroneal muscles.  Letter  fasciitis  Plan: Placed her on a Medrol Dosepak to be followed by Mobic had recommend her set of orthotics.  After sterile Betadine skin prep I injected 20 mill grams Kenalog 5 mg Marcaine bilateral heels.  Tolerated procedure well.  Should any of this fails to alleviate her symptoms I think the next step would be to send her to neurosurgery for nerve conduction velocity exam at the very least.    Max T. Carmichaels, [place]

## 2018-07-06 ENCOUNTER — Encounter: Payer: Self-pay | Admitting: Podiatry

## 2018-07-20 ENCOUNTER — Other Ambulatory Visit: Payer: Managed Care, Other (non HMO) | Admitting: Orthotics

## 2018-08-03 ENCOUNTER — Other Ambulatory Visit: Payer: Managed Care, Other (non HMO) | Admitting: Orthotics

## 2018-08-03 ENCOUNTER — Ambulatory Visit (INDEPENDENT_AMBULATORY_CARE_PROVIDER_SITE_OTHER): Payer: Managed Care, Other (non HMO) | Admitting: Orthotics

## 2018-08-03 DIAGNOSIS — M722 Plantar fascial fibromatosis: Secondary | ICD-10-CM

## 2018-08-03 DIAGNOSIS — M7750 Other enthesopathy of unspecified foot: Secondary | ICD-10-CM

## 2018-08-03 NOTE — Progress Notes (Signed)
Patient came in today to pick up custom made foot orthotics.  The goals were accomplished and the patient reported no dissatisfaction with said orthotics.  Patient was advised of breakin period and how to report any issues. 

## 2018-08-17 ENCOUNTER — Other Ambulatory Visit: Payer: Managed Care, Other (non HMO) | Admitting: Orthotics

## 2018-08-19 ENCOUNTER — Ambulatory Visit (INDEPENDENT_AMBULATORY_CARE_PROVIDER_SITE_OTHER): Payer: Managed Care, Other (non HMO) | Admitting: Internal Medicine

## 2018-08-19 ENCOUNTER — Encounter: Payer: Self-pay | Admitting: Internal Medicine

## 2018-08-19 VITALS — BP 120/78 | HR 69 | Temp 97.9°F | Resp 16 | Ht 63.0 in | Wt 195.0 lb

## 2018-08-19 DIAGNOSIS — R739 Hyperglycemia, unspecified: Secondary | ICD-10-CM

## 2018-08-19 DIAGNOSIS — Z13220 Encounter for screening for lipoid disorders: Secondary | ICD-10-CM

## 2018-08-19 DIAGNOSIS — Z0000 Encounter for general adult medical examination without abnormal findings: Secondary | ICD-10-CM

## 2018-08-19 DIAGNOSIS — F439 Reaction to severe stress, unspecified: Secondary | ICD-10-CM

## 2018-08-19 DIAGNOSIS — Z9109 Other allergy status, other than to drugs and biological substances: Secondary | ICD-10-CM

## 2018-08-19 DIAGNOSIS — R7989 Other specified abnormal findings of blood chemistry: Secondary | ICD-10-CM

## 2018-08-19 DIAGNOSIS — R945 Abnormal results of liver function studies: Secondary | ICD-10-CM

## 2018-08-19 NOTE — Progress Notes (Signed)
Patient ID: Tasha Woods, female   DOB: 02-Oct-1970, 46 y.o.   MRN: [id number]   Subjective:    Patient ID: Tasha Woods, female    DOB: [birth year], 47 y.o.   MRN: [id number]  HPI  Patient here for her physical exam.  She has been doing relatively well.  Trying to stay active.  No chest pain.  No sob.  No acid reflux.  No abdominal pain.  Bowels moving.  No urine change.  Increased stress with her mother's health issues.  Discussed with her today.  Overall feels she is handling things relatively well.  On lexapro and doing well.  Follow.     Past Medical History:  Diagnosis Date  . Allergy   . Anxiety   . GERD (gastroesophageal reflux disease)    Past Surgical History:  Procedure Laterality Date  . CHOLECYSTECTOMY  06/08/14  . COLONOSCOPY  04-30-14   Dr Vira Agar  . INNER EAR SURGERY     age 81  . UPPER GI ENDOSCOPY  04-30-14   Dr Vira Agar   Family History  Problem Relation Age of Onset  . Colon polyps Mother   . Hypertension Father   . Breast cancer Neg Hx   . Colon cancer Neg Hx    Social History   Socioeconomic History  . Marital status: Married    Spouse name: Not on file  . Number of children: 2  . Years of education: Not on file  . Highest education level: Not on file  Occupational History    Employer: [employer]  . Financial resource strain: Not on file  . Food insecurity:    Worry: Not on file    Inability: Not on file  . Transportation needs:    Medical: Not on file    Non-medical: Not on file  Tobacco Use  . Smoking status: Never Smoker  . Smokeless tobacco: Never Used  Substance and Sexual Activity  . Alcohol use: Yes    Alcohol/week: 0.0 standard drinks    Comment: occasionally  . Drug use: No  . Sexual activity: Yes    Birth control/protection: I.U.D.    Comment: mirena  Lifestyle  . Physical activity:    Days per week: Not on file    Minutes per session: Not on file  . Stress: Not on file  Relationships  . Social connections:   Talks on phone: Not on file    Gets together: Not on file    Attends religious service: Not on file    Active member of club or organization: Not on file    Attends meetings of clubs or organizations: Not on file    Relationship status: Not on file  Other Topics Concern  . Not on file  Social History Narrative  . Not on file    Outpatient Encounter Medications as of 08/19/2018  Medication Sig  . escitalopram (LEXAPRO) 20 MG tablet Take by mouth.  . famotidine (PEPCID) 40 MG tablet TAKE 1 TABLET BY MOUTH  EVERY EVENING  . fluticasone (FLONASE) 50 MCG/ACT nasal spray USE 2 SPRAYS IN EACH  NOSTRIL DAILY  . fluticasone-salmeterol (ADVAIR HFA) 230-21 MCG/ACT inhaler USE 2 PUFFS TWO TIMES DAILY  . montelukast (SINGULAIR) 10 MG tablet Take 1 tablet (10 mg total) by mouth at bedtime.  . naproxen sodium (ALEVE) 220 MG tablet Take 220 mg by mouth.  . Respiratory Therapy Supplies (FLUTTER) DEVI Use 10-15 times daily  . [DISCONTINUED] meloxicam (MOBIC) 15 MG  tablet Take 1 tablet (15 mg total) by mouth daily.  . [DISCONTINUED] methylPREDNISolone (MEDROL DOSEPAK) 4 MG TBPK tablet 6 day dose pack - take as directed   No facility-administered encounter medications on file as of 08/19/2018.     Review of Systems  Constitutional: Negative for appetite change and unexpected weight change.  HENT: Negative for congestion and sinus pressure.   Eyes: Negative for pain and visual disturbance.  Respiratory: Negative for cough, chest tightness and shortness of breath.   Cardiovascular: Negative for chest pain, palpitations and leg swelling.  Gastrointestinal: Negative for abdominal pain, diarrhea, nausea and vomiting.  Genitourinary: Negative for difficulty urinating and dysuria.  Musculoskeletal: Negative for joint swelling and myalgias.  Skin: Negative for color change and rash.  Neurological: Negative for dizziness, light-headedness and headaches.  Hematological: Negative for adenopathy. Does not  bruise/bleed easily.  Psychiatric/Behavioral: Negative for agitation and dysphoric mood.       Objective:    Physical Exam Constitutional:      General: She is not in acute distress.    Appearance: Normal appearance. She is well-developed.  HENT:     Nose: Nose normal. No congestion.     Mouth/Throat:     Pharynx: No oropharyngeal exudate or posterior oropharyngeal erythema.  Eyes:     General: No scleral icterus.       Right eye: No discharge.        Left eye: No discharge.  Neck:     Musculoskeletal: Neck supple. No muscular tenderness.     Thyroid: No thyromegaly.  Cardiovascular:     Rate and Rhythm: Normal rate and regular rhythm.  Pulmonary:     Effort: No tachypnea, accessory muscle usage or respiratory distress.     Breath sounds: Normal breath sounds. No decreased breath sounds, wheezing or rhonchi.  Chest:     Breasts:        Right: No inverted nipple, mass, nipple discharge or tenderness (no axillary adenopathy).        Left: No inverted nipple, mass, nipple discharge or tenderness (no axilarry adenopathy).  Abdominal:     General: Bowel sounds are normal.     Palpations: Abdomen is soft.     Tenderness: There is no abdominal tenderness.  Musculoskeletal:        General: No swelling or tenderness.  Lymphadenopathy:     Cervical: No cervical adenopathy.  Skin:    Findings: No erythema or rash.  Neurological:     Mental Status: She is alert and oriented to person, place, and time.  Psychiatric:        Mood and Affect: Mood normal.        Behavior: Behavior normal.     BP 120/78 (BP Location: Left Arm, Patient Position: Sitting, Cuff Size: Normal)   Pulse 69   Temp 97.9 F (36.6 C) (Oral)   Resp 16   Ht 5' 3"  (1.6 m)   Wt 195 lb (88.5 kg)   SpO2 98%   BMI 34.54 kg/m  Wt Readings from Last 3 Encounters:  08/19/18 195 lb (88.5 kg)  03/24/18 193 lb 6.4 oz (87.7 kg)  09/10/17 189 lb (85.7 kg)     Lab Results  Component Value Date   WBC 5.2  05/24/2017   HGB 13.3 05/24/2017   HCT 39.4 05/24/2017   PLT 216 05/24/2017   GLUCOSE 103 (H) 05/24/2017   CHOL 187 05/24/2017   TRIG 104 05/24/2017   HDL 46 05/24/2017   LDLCALC 120 (  H) 05/24/2017   ALT 40 (H) 05/24/2017   AST 26 05/24/2017   NA 142 05/24/2017   K 4.7 05/24/2017   CL 105 05/24/2017   CREATININE 0.82 05/24/2017   BUN 18 05/24/2017   CO2 22 05/24/2017   TSH 3.400 05/24/2017    Mm 3d Screen Breast Bilateral  Result Date: 04/13/2018 CLINICAL DATA:  Screening. EXAM: DIGITAL SCREENING BILATERAL MAMMOGRAM WITH TOMO AND CAD COMPARISON:  Previous exam(s). ACR Breast Density Category b: There are scattered areas of fibroglandular density. FINDINGS: There are no findings suspicious for malignancy. Images were processed with CAD. IMPRESSION: No mammographic evidence of malignancy. A result letter of this screening mammogram will be mailed directly to the patient. RECOMMENDATION: Screening mammogram in one year. (Code:SM-B-01Y) BI-RADS CATEGORY  1: Negative. Electronically Signed   By: Marin Olp M.D.   On: 04/13/2018 12:26       Assessment & Plan:   Problem List Items Addressed This Visit    Environmental allergies    Controlled on current regimen.  Follow.        Relevant Orders   CBC with Differential/Platelet   Hepatic function panel   TSH   VITAMIN D 25 Hydroxy (Vit-D Deficiency, Fractures)   Health care maintenance    Physical today 08/19/18.  PAP 04/16/16 - negative with negative HPV.  Colonoscopy 03/2014.  Recommended f/u in 04/2019.        Stress    Increased stress as outlined.  Discussed with her today.  On lexapro.  Stable.  Follow.         Other Visit Diagnoses    Routine general medical examination at a health care facility    -  Primary   Hyperglycemia       Relevant Orders   Hemoglobin T7D   Basic metabolic panel   Screening cholesterol level       Relevant Orders   Lipid panel       Einar Pheasant, MD

## 2018-08-19 NOTE — Assessment & Plan Note (Signed)
Physical today 08/19/18.  PAP 04/16/16 - negative with negative HPV.  Colonoscopy 03/2014.  Recommended f/u in 04/2019.

## 2018-08-25 ENCOUNTER — Encounter: Payer: Self-pay | Admitting: Internal Medicine

## 2018-08-27 ENCOUNTER — Encounter: Payer: Self-pay | Admitting: Internal Medicine

## 2018-08-27 NOTE — Assessment & Plan Note (Signed)
Controlled on current regimen.  Follow.  

## 2018-08-27 NOTE — Assessment & Plan Note (Signed)
Increased stress as outlined.  Discussed with her today.  On lexapro.  Stable.  Follow.

## 2018-08-28 NOTE — Telephone Encounter (Signed)
Please call pt (name in Naimah's my chart message) and see how she is doing.  I can work her in earlier if needed.  I can see her at 8:00 on 09/07/18 or 12:00 on 09/08/18.

## 2018-08-29 NOTE — Telephone Encounter (Signed)
Patient scheduled for 09/07/18 at 8

## 2018-10-03 ENCOUNTER — Encounter: Payer: Self-pay | Admitting: Internal Medicine

## 2018-10-03 MED ORDER — OSELTAMIVIR PHOSPHATE 75 MG PO CAPS
75.0000 mg | ORAL_CAPSULE | Freq: Every day | ORAL | 0 refills | Status: DC
Start: 2018-10-03 — End: 2018-11-03

## 2018-10-03 NOTE — Telephone Encounter (Signed)
Called and spoke to pt.  She has been exposed to her son (and in direct contact with her son) who has been diagnosed with the flu.  tamiflu rx sent in and patient instructed to take one tablet q day for 10 days.  Will change to treatment dose if symptoms.  Any problems, will call.

## 2018-11-03 ENCOUNTER — Encounter: Payer: Self-pay | Admitting: Obstetrics and Gynecology

## 2018-11-03 ENCOUNTER — Ambulatory Visit: Payer: Managed Care, Other (non HMO) | Admitting: Obstetrics and Gynecology

## 2018-11-03 VITALS — BP 118/74 | HR 88 | Ht 63.0 in | Wt 196.7 lb

## 2018-11-03 DIAGNOSIS — R300 Dysuria: Secondary | ICD-10-CM | POA: Diagnosis not present

## 2018-11-03 DIAGNOSIS — R102 Pelvic and perineal pain: Secondary | ICD-10-CM

## 2018-11-03 LAB — POCT URINALYSIS DIPSTICK
Bilirubin, UA: NEGATIVE
Blood, UA: NEGATIVE
Glucose, UA: NEGATIVE
Ketones, UA: NEGATIVE
Leukocytes, UA: NEGATIVE
Nitrite, UA: NEGATIVE
Protein, UA: NEGATIVE
Spec Grav, UA: 1.01 (ref 1.010–1.025)
Urobilinogen, UA: 0.2 E.U./dL
pH, UA: 6 (ref 5.0–8.0)

## 2018-11-03 MED ORDER — TRAMADOL HCL 50 MG PO TABS: 50.0000 mg | ORAL_TABLET | Freq: Four times a day (QID) | ORAL | 0 refills | Status: DC | PRN

## 2018-11-03 NOTE — Patient Instructions (Signed)
Pelvic Pain, Female Pelvic pain is pain in your lower abdomen, below your belly button and between your hips. The pain may start suddenly (be acute), keep coming back (be recurring), or last a long time (become chronic). Pelvic pain that lasts longer than 6 months is considered chronic. Pelvic pain may affect your:  Reproductive organs.  Urinary system.  Digestive tract.  Musculoskeletal system. There are many potential causes of pelvic pain. Sometimes, the pain can be a result of digestive or urinary conditions, strained muscles or ligaments, or reproductive conditions. Sometimes the cause of pelvic pain is not known. Follow these instructions at home:   Take over-the-counter and prescription medicines only as told by your health care provider.  Rest as told by your health care provider.  Do not have sex if it hurts.  Keep a journal of your pelvic pain. Write down: ? When the pain started. ? Where the pain is located. ? What seems to make the pain better or worse, such as food or your period (menstrual cycle). ? Any symptoms you have along with the pain.  Keep all follow-up visits as told by your health care provider. This is important. Contact a health care provider if:  Medicine does not help your pain.  Your pain comes back.  You have new symptoms.  You have abnormal vaginal discharge or bleeding, including bleeding after menopause.  You have a fever or chills.  You are constipated.  You have blood in your urine or stool.  You have foul-smelling urine.  You feel weak or light-headed. Get help right away if:  You have sudden severe pain.  Your pain gets steadily worse.  You have severe pain along with fever, nausea, vomiting, or excessive sweating.  You lose consciousness. Summary  Pelvic pain is pain in your lower abdomen, below your belly button and between your hips.  There are many potential causes of pelvic pain.  Keep a journal of your pelvic  pain. This information is not intended to replace advice given to you by your health care provider. Make sure you discuss any questions you have with your health care provider. Document Released: 07/14/2004 Document Revised: 02/02/2018 Document Reviewed: 02/02/2018 Elsevier Interactive Patient Education  2019 [employer].

## 2018-11-03 NOTE — Progress Notes (Signed)
  Subjective:     Patient ID: Tasha Woods, female   DOB: August 07, 1971, 48 y.o.   MRN: [id number]  HPI Reports RLQ pain for about a week that got worse last night. Describes the pain is sharp and radiates into right groin and vagina. Denies fever, or changes in urination or BMS. Does report h/o right ovarian cyst about 5 years ago that felt the same way.has been menopausla for 2 years.  Also is having frequent hot flashes, and they have been worse for the last week.   Review of Systems  Genitourinary: Positive for pelvic pain.  All other systems reviewed and are negative.      Objective:   Physical Exam A&Ox4 Well groomed female in no distress Blood pressure 118/74, pulse 88, height 5' 3"  (1.6 m), weight 196 lb 11.2 oz (89.2 kg).    Urinalysis    Component Value Date/Time   COLORURINE Straw 04/23/2014 1219   APPEARANCEUR Clear 04/23/2014 1219   LABSPEC 1.004 04/23/2014 1219   PHURINE 7.0 04/23/2014 1219   GLUCOSEU Negative 04/23/2014 1219   HGBUR Negative 04/23/2014 1219   BILIRUBINUR neg 11/03/2018 1401   BILIRUBINUR Negative 04/23/2014 1219   KETONESUR Negative 04/23/2014 1219   PROTEINUR Negative 11/03/2018 1401   PROTEINUR Negative 04/23/2014 1219   UROBILINOGEN 0.2 11/03/2018 1401   NITRITE neg 11/03/2018 1401   NITRITE Negative 04/23/2014 1219   LEUKOCYTESUR Negative 11/03/2018 1401   LEUKOCYTESUR Negative 04/23/2014 1219  abdomen soft and not tender Pelvic exam: VULVA: normal appearing vulva with no masses, tenderness or lesions, VAGINA: normal appearing vagina with normal color and discharge, no lesions, CERVIX: normal appearing cervix without discharge or lesions, cervical motion tenderness absent, UTERUS: uterus is normal size, shape, consistency and nontender, ADNEXA: tenderness right with slight fullness.  Assessment:     RLQ pelvic pain H/o ovarian cyst Menopausal hotflashes    Plan:     counseled on findings and desires hormonal suppression of suspected  cyst. Will schedule pelvic u/s and follow up accordingly in 8 weeks. Started on LoLoestrin and given 30 tablets of tramadol for prn use.   Melody Shambley,CNM

## 2018-11-04 ENCOUNTER — Telehealth: Payer: Self-pay | Admitting: Obstetrics and Gynecology

## 2018-11-04 ENCOUNTER — Other Ambulatory Visit: Payer: Self-pay | Admitting: Obstetrics and Gynecology

## 2018-11-04 DIAGNOSIS — R102 Pelvic and perineal pain: Secondary | ICD-10-CM

## 2018-11-04 NOTE — Telephone Encounter (Signed)
pls advise

## 2018-11-04 NOTE — Telephone Encounter (Signed)
Victorino Dike called and asked if this patient needs an order for the transvaginal as well as the transabdominal?  If so, please place the transvag US order today, as patient is due in on Monday, 11/07/18 at 3:15 PM, please advise, thanks.

## 2018-11-07 ENCOUNTER — Other Ambulatory Visit: Payer: Self-pay | Admitting: *Deleted

## 2018-11-07 ENCOUNTER — Encounter: Payer: Self-pay | Admitting: Internal Medicine

## 2018-11-07 ENCOUNTER — Ambulatory Visit
Admission: RE | Admit: 2018-11-07 | Discharge: 2018-11-07 | Disposition: A | Discharge status: Home / Self Care | Payer: Managed Care, Other (non HMO) | Source: Ambulatory Visit | Attending: Obstetrics and Gynecology | Admitting: Obstetrics and Gynecology

## 2018-11-07 DIAGNOSIS — R102 Pelvic and perineal pain: Secondary | ICD-10-CM | POA: Insufficient documentation

## 2018-11-08 ENCOUNTER — Telehealth: Payer: Self-pay | Admitting: Obstetrics and Gynecology

## 2018-11-08 NOTE — Telephone Encounter (Signed)
pls advise

## 2018-11-08 NOTE — Telephone Encounter (Signed)
The patient called and stated that she would like to speak with Melody in regards to her wanting to know the status of her ultrasound that was done yesterday. Please advise.

## 2018-11-09 MED ORDER — SCOPOLAMINE 1 MG/3DAYS TD PT72: 1.0000 | MEDICATED_PATCH | TRANSDERMAL | 0 refills | Status: DC

## 2018-11-09 NOTE — Telephone Encounter (Signed)
Called atient and discussed ultrasound findings. Do not feel like pain is gyn in nature and she is to stop OCPs.

## 2018-11-09 NOTE — Telephone Encounter (Signed)
The patient is asking for her [place] results from Monday, please call her cell, please advise, thanks.

## 2018-11-09 NOTE — Telephone Encounter (Signed)
The patient called a 3rd time wanting to urgently speak with Amy or Melody in regards to her u/s results today. The patient stated [place] is going out of the [place]. Please advise.

## 2018-11-11 ENCOUNTER — Ambulatory Visit: Payer: Managed Care, Other (non HMO) | Admitting: Family Medicine

## 2018-11-11 ENCOUNTER — Encounter: Payer: Self-pay | Admitting: Family Medicine

## 2018-11-11 ENCOUNTER — Other Ambulatory Visit: Payer: Self-pay

## 2018-11-11 VITALS — BP 108/76 | HR 77 | Temp 98.1°F | Resp 18 | Ht 63.0 in | Wt 196.0 lb

## 2018-11-11 DIAGNOSIS — R1031 Right lower quadrant pain: Secondary | ICD-10-CM

## 2018-11-11 LAB — POCT URINALYSIS DIPSTICK
Bilirubin, UA: NEGATIVE
Blood, UA: NEGATIVE
Glucose, UA: NEGATIVE
Ketones, UA: NEGATIVE
Leukocytes, UA: NEGATIVE
Nitrite, UA: NEGATIVE
Protein, UA: NEGATIVE
Spec Grav, UA: >=1.03 — AB (ref 1.010–1.025)
Urobilinogen, UA: 0.2 E.U./dL
pH, UA: 5.5 (ref 5.0–8.0)

## 2018-11-11 NOTE — Progress Notes (Signed)
Subjective:    Patient ID: Tasha Woods, female    DOB: 1970-11-18, 48 y.o.   MRN: [id number]  HPI   Patient resents to clinic due to right lower quadrant abdominal pain that has been present off and on for many weeks.  Denies fever or chills, denies nausea or vomiting, denies diarrhea or pain with urination.  Patient is concerned she may possibly have endometriosis.  Did have pelvic ultrasound ordered by her GYN that was done on November 07, 2018.  Pelvic ultrasound results reviewed and are as follows:  Pelvic US done 11/07/2018 IMPRESSION: 1. Markedly limited study secondary to under filling of the bladder, bowel gas, habitus, and uterine position. 2. The bilateral ovaries are nonvisualized. 3. Possible echogenic focus/calcification at the uterine endometrial/myometrial interface. No discrete uterine mass.  Patient states she is not happy with the results of the ultrasound as both ovaries were not visualized.  Patient continues to have right lower quadrant pain in both like to know reasons why.  Patient Active Problem List   Diagnosis Date Noted  . Mild concussion 09/12/2017  . Fatty liver disease, nonalcoholic 12/23/2015  . Health care maintenance 12/09/2014  . BMI 32.0-32.9,adult 08/27/2014  . Stress 08/27/2014  . Chronic cholecystitis 06/19/2014  . Family history of colonic polyps 05/10/2014  . Reflux esophagitis 05/10/2014  . Environmental allergies 11/27/2012  . Menopausal syndrome 08/07/2012   Social History   Tobacco Use  . Smoking status: Never Smoker  . Smokeless tobacco: Never Used  Substance Use Topics  . Alcohol use: Yes    Alcohol/week: 0.0 standard drinks    Comment: occasionally   Review of Systems   Constitutional: Negative for chills, fatigue and fever.  HENT: Negative for congestion, ear pain, sinus pain and sore throat.   Eyes: Negative.   Respiratory: Negative for cough, shortness of breath and wheezing.   Cardiovascular: Negative for chest pain,  palpitations and leg swelling.  Gastrointestinal: Negative for diarrhea, nausea and vomiting.  Genitourinary: +RLQ ABD pain. Negative for dysuria, frequency and urgency.  Musculoskeletal: Negative for arthralgias and myalgias.  Skin: Negative for color change, pallor and rash.  Neurological: Negative for syncope, light-headedness and headaches.  Psychiatric/Behavioral: The patient is not nervous/anxious.       Objective:   Physical Exam Vitals signs and nursing note reviewed.  Constitutional:      Appearance: She is well-developed.  HENT:     Head: Normocephalic and atraumatic.     Right Ear: External ear normal.     Left Ear: External ear normal.     Nose: Nose normal.  Eyes:     General: No scleral icterus.       Right eye: No discharge.        Left eye: No discharge.     Conjunctiva/sclera: Conjunctivae normal.     Pupils: Pupils are equal, round, and reactive to light.  Neck:     Musculoskeletal: Normal range of motion and neck supple.     Trachea: No tracheal deviation.  Cardiovascular:     Rate and Rhythm: Normal rate and regular rhythm.     Heart sounds: Normal heart sounds. No murmur. No friction rub. No gallop.   Pulmonary:     Effort: Pulmonary effort is normal. No respiratory distress.     Breath sounds: Normal breath sounds. No wheezing or rales.  Chest:     Chest wall: No tenderness.  Abdominal:     General: Bowel sounds are normal.  Palpations: Abdomen is soft.     Tenderness: There is abdominal tenderness (RLQ/right adnexal) in the right lower quadrant. There is no guarding or rebound.  Musculoskeletal: Normal range of motion.        General: No deformity.  Lymphadenopathy:     Cervical: No cervical adenopathy.  Skin:    General: Skin is warm and dry.     Capillary Refill: Capillary refill takes less than 2 seconds.     Coloration: Skin is not pale.     Findings: No erythema.  Neurological:     Mental Status: She is alert and oriented to person,  place, and time.     Cranial Nerves: No cranial nerve deficit.  Psychiatric:        Behavior: Behavior normal.        Thought Content: Thought content normal.    Vitals:   11/11/18 1313  BP: 108/76  Pulse: 77  Resp: 18  Temp: 98.1 F (36.7 C)  SpO2: 97%       Assessment & Plan:   Right lower quadrant abdominal pain- patient appears nontoxic and in no acute distress, is somewhat tender on exam of the right lower quadrant/right adnexal area.  Pelvic ultrasound did not reveal any obvious cause for her pain.  We will do CBC and CMP to further investigate as well as CT of abdomen and pelvis.  Advised that if pain worsens in any way, develops fever or chills, nausea/vomiting or diarrhea, issues with urination to call office right away and or go to ER for evaluation.  Patient aware that someone will call her in regards to getting a CT scan set up.  Otherwise advised to keep regularly scheduled follow-up with PCP as planned and we will base our next step in her care plan once we have lab and imaging results.

## 2018-11-12 ENCOUNTER — Other Ambulatory Visit: Payer: Self-pay | Admitting: Internal Medicine

## 2018-11-12 LAB — COMPREHENSIVE METABOLIC PANEL
ALT: 46 IU/L — ABNORMAL HIGH (ref 0–32)
AST: 44 IU/L — ABNORMAL HIGH (ref 0–40)
Albumin/Globulin Ratio: 2 (ref 1.2–2.2)
Albumin: 4.5 g/dL (ref 3.8–4.8)
Alkaline Phosphatase: 81 IU/L (ref 39–117)
BUN/Creatinine Ratio: 22 (ref 9–23)
BUN: 17 mg/dL (ref 6–24)
Bilirubin Total: 0.3 mg/dL (ref 0.0–1.2)
CO2: 21 mmol/L (ref 20–29)
Calcium: 9.4 mg/dL (ref 8.7–10.2)
Chloride: 105 mmol/L (ref 96–106)
Creatinine, Ser: 0.76 mg/dL (ref 0.57–1.00)
GFR calc Af Amer: 107 mL/min/{1.73_m2} (ref >59)
GFR calc non Af Amer: 92 mL/min/{1.73_m2} (ref >59)
Globulin, Total: 2.3 g/dL (ref 1.5–4.5)
Glucose: 85 mg/dL (ref 65–99)
Potassium: 4.6 mmol/L (ref 3.5–5.2)
Sodium: 142 mmol/L (ref 134–144)
Total Protein: 6.8 g/dL (ref 6.0–8.5)

## 2018-11-12 LAB — CBC WITH DIFFERENTIAL/PLATELET
Basophils Absolute: 0.1 10*3/uL (ref 0.0–0.2)
Basos: 1 %
EOS (ABSOLUTE): 0.3 10*3/uL (ref 0.0–0.4)
Eos: 4 %
Hematocrit: 37.2 % (ref 34.0–46.6)
Hemoglobin: 12.6 g/dL (ref 11.1–15.9)
Immature Grans (Abs): 0 10*3/uL (ref 0.0–0.1)
Immature Granulocytes: 0 %
Lymphocytes Absolute: 2.2 10*3/uL (ref 0.7–3.1)
Lymphs: 32 %
MCH: 28.3 pg (ref 26.6–33.0)
MCHC: 33.9 g/dL (ref 31.5–35.7)
MCV: 84 fL (ref 79–97)
Monocytes Absolute: 0.5 10*3/uL (ref 0.1–0.9)
Monocytes: 7 %
Neutrophils Absolute: 4.1 10*3/uL (ref 1.4–7.0)
Neutrophils: 56 %
Platelets: 252 10*3/uL (ref 150–450)
RBC: 4.45 x10E6/uL (ref 3.77–5.28)
RDW: 12.9 % (ref 11.7–15.4)
WBC: 7.1 10*3/uL (ref 3.4–10.8)

## 2018-12-08 ENCOUNTER — Other Ambulatory Visit: Payer: Self-pay | Admitting: Internal Medicine

## 2018-12-23 ENCOUNTER — Ambulatory Visit: Admission: RE | Admit: 2018-12-23 | Payer: Managed Care, Other (non HMO) | Source: Ambulatory Visit

## 2018-12-29 ENCOUNTER — Encounter: Payer: Self-pay | Admitting: Internal Medicine

## 2018-12-29 ENCOUNTER — Ambulatory Visit (INDEPENDENT_AMBULATORY_CARE_PROVIDER_SITE_OTHER): Payer: Managed Care, Other (non HMO) | Admitting: Internal Medicine

## 2018-12-29 ENCOUNTER — Other Ambulatory Visit: Payer: Self-pay

## 2018-12-29 DIAGNOSIS — K2100 Gastro-esophageal reflux disease with esophagitis, without bleeding: Secondary | ICD-10-CM

## 2018-12-29 DIAGNOSIS — R1031 Right lower quadrant pain: Secondary | ICD-10-CM

## 2018-12-29 DIAGNOSIS — F439 Reaction to severe stress, unspecified: Secondary | ICD-10-CM | POA: Diagnosis not present

## 2018-12-29 DIAGNOSIS — Z8371 Family history of colonic polyps: Secondary | ICD-10-CM | POA: Diagnosis not present

## 2018-12-29 NOTE — Progress Notes (Signed)
Patient ID: Tasha Woods, female   DOB: 03-19-1971, 48 y.o.   MRN: [id number] Virtual Visit via video Note  This visit type was conducted due to national recommendations for restrictions regarding the COVID-19 pandemic (e.g. social distancing).  This format is felt to be most appropriate for this patient at this time.  All issues noted in this document were discussed and addressed.  No physical exam was performed (except for noted visual exam findings with Video Visits).   I connected with Eugina Row by a video enabled telemedicine application or telephone and verified that I am speaking with the correct person using two identifiers. Location patient: home Location provider: work Persons participating in the virtual visit: patient, provider  I discussed the limitations, risks, security and privacy concerns of performing an evaluation and management service by video and the availability of in person appointments.  The patient expressed understanding and agreed to proceed.   Reason for visit: scheduled follow up appt.   HPI: She reports she is doing relatively well.  She is working from home.  Handling stress ok.  On citalopram.  Trying to stay in.  No known COVID exposure.  No fever.  No cough, congestion or sob. She was evaluated 11/03/18 by gyn for RLQ pain.  Was scheduled for pelvic ultrasound.  Started on loestrin.  Planned f/u in 8 weeks.   Per gyn note, no ultrasound findings to explain pain.  She was instructed to stop ocp's.  Reevaluated by Philis Nettle 11/11/18. Was scheduled for CT abdomen and pelvis.  Labs unrevealing.  CT not scheduled until 01/2019.  Would like to have scan earlier.  No other abdominal pain.  No urine change.  Bowels moving.  No diarrhea.    ROS: See pertinent positives and negatives per HPI.  Past Medical History:  Diagnosis Date  . Allergy   . Anxiety   . GERD (gastroesophageal reflux disease)     Past Surgical History:  Procedure Laterality Date  .  CHOLECYSTECTOMY  06/08/14  . COLONOSCOPY  04-30-14   Dr Vira Agar  . INNER EAR SURGERY     age 1  . UPPER GI ENDOSCOPY  04-30-14   Dr Vira Agar    Family History  Problem Relation Age of Onset  . Colon polyps Mother   . Hypertension Father   . Breast cancer Neg Hx   . Colon cancer Neg Hx     SOCIAL HX: reviewed.    Current Outpatient Medications:  .  citalopram (CELEXA) 20 MG tablet, TAKE 1 TABLET BY MOUTH  DAILY, Disp: 90 tablet, Rfl: 1 .  famotidine (PEPCID) 40 MG tablet, TAKE 1 TABLET BY MOUTH  EVERY EVENING, Disp: 90 tablet, Rfl: 1 .  fluticasone (FLONASE) 50 MCG/ACT nasal spray, USE 2 SPRAYS IN EACH  NOSTRIL DAILY, Disp: 48 g, Rfl: 1 .  fluticasone-salmeterol (ADVAIR HFA) 230-21 MCG/ACT inhaler, USE 2 PUFFS TWO TIMES DAILY, Disp: 36 g, Rfl: 3 .  montelukast (SINGULAIR) 10 MG tablet, Take 1 tablet (10 mg total) by mouth at bedtime., Disp: 90 tablet, Rfl: 3 .  naproxen sodium (ALEVE) 220 MG tablet, Take 220 mg by mouth., Disp: , Rfl:  .  Respiratory Therapy Supplies (FLUTTER) DEVI, Use 10-15 times daily, Disp: 1 each, Rfl: 0 .  traMADol (ULTRAM) 50 MG tablet, Take 1 tablet (50 mg total) by mouth every 6 (six) hours as needed for moderate pain., Disp: 30 tablet, Rfl: 0  EXAM:  GENERAL: alert, oriented, appears well and in no acute  distress  HEENT: atraumatic, conjunttiva clear, no obvious abnormalities on inspection of external nose and ears  NECK: normal movements of the head and neck  LUNGS: on inspection no signs of respiratory distress, breathing rate appears normal, no obvious gross SOB, gasping or wheezing  CV: no obvious cyanosis  PSYCH/NEURO: pleasant and cooperative, no obvious depression or anxiety, speech and thought processing grossly intact  ASSESSMENT AND PLAN:  Discussed the following assessment and plan:  Family history of colonic polyps  Stress  Reflux esophagitis  RLQ abdominal pain  Family history of colonic polyps Colonoscopy 03/2014.   Recommended f/u colonoscopy in 04/2019.    Stress Overall handling things well.  On citalopram.  Doing well on this medication.  Does not feel needs anything more at this time.  Follow.    Reflux esophagitis Controlled on current regimen.  Follow.    RLQ abdominal pain Right lower quadrant pain as outlined.  Pelvic ultrasound reviewed.  Per note, did not feel gyn etiology to explain her pain.  Instructed to stop ocp's.  Scheduled for CT abdomen/pelvis.  She request to have the scan earlier given persistent issues.      I discussed the assessment and treatment plan with the patient. The patient was provided an opportunity to ask questions and all were answered. The patient agreed with the plan and demonstrated an understanding of the instructions.   The patient was advised to call back or seek an in-person evaluation if the symptoms worsen or if the condition fails to improve as anticipated.    Einar Pheasant, MD

## 2019-01-01 ENCOUNTER — Encounter: Payer: Self-pay | Admitting: Internal Medicine

## 2019-01-01 DIAGNOSIS — R1031 Right lower quadrant pain: Secondary | ICD-10-CM | POA: Insufficient documentation

## 2019-01-01 NOTE — Assessment & Plan Note (Signed)
Controlled on current regimen.  Follow.  

## 2019-01-01 NOTE — Assessment & Plan Note (Signed)
Overall handling things well.  On citalopram.  Doing well on this medication.  Does not feel needs anything more at this time.  Follow.

## 2019-01-01 NOTE — Assessment & Plan Note (Signed)
Right lower quadrant pain as outlined.  Pelvic ultrasound reviewed.  Per note, did not feel gyn etiology to explain her pain.  Instructed to stop ocp's.  Scheduled for CT abdomen/pelvis.  She request to have the scan earlier given persistent issues.

## 2019-01-01 NOTE — Assessment & Plan Note (Signed)
Colonoscopy 03/2014.  Recommended f/u colonoscopy in 04/2019.

## 2019-01-10 ENCOUNTER — Encounter: Payer: Self-pay | Admitting: Family Medicine

## 2019-01-10 ENCOUNTER — Ambulatory Visit
Admission: RE | Admit: 2019-01-10 | Discharge: 2019-01-10 | Disposition: A | Discharge status: Home / Self Care | Payer: Managed Care, Other (non HMO) | Source: Ambulatory Visit | Attending: Family Medicine | Admitting: Family Medicine

## 2019-01-10 ENCOUNTER — Other Ambulatory Visit: Payer: Self-pay

## 2019-01-10 ENCOUNTER — Ambulatory Visit: Payer: Self-pay

## 2019-01-10 DIAGNOSIS — R1031 Right lower quadrant pain: Secondary | ICD-10-CM | POA: Insufficient documentation

## 2019-01-10 HISTORY — DX: Unspecified asthma, uncomplicated: J45.909

## 2019-01-10 MED ORDER — IOHEXOL 300 MG/ML  SOLN
100.0000 mL | Freq: Once | INTRAMUSCULAR | Status: AC | PRN
  Administered 2019-01-10: 100 mL via INTRAVENOUS

## 2019-01-10 NOTE — Telephone Encounter (Signed)
Reviewed chart.  It appears that Tasha Woods has discussed CT results with her and has talked with her regarding her current symptoms.  She has referred her to GI (Dr Tiffany Kocher).  She is to call with update.  She request to see Dr Tiffany Kocher.  Can we go ahead and get an appt asap.  Let me know either way.  I can call GI if I need to.

## 2019-01-10 NOTE — Telephone Encounter (Signed)
Pt called to say that she has a CT scan today and hs had some diarrhea after drinking the contrast. She states that she noticed her legs were achy and she has a low grade fever 99.7.  She denies sore throat, sinus issues, cough, ear pain. No other symptoms. Per protocol pt was read home care instructions.  Pt verbalized understanding of all instructions. Pt will call back with worsening symptoms.   Reason for Disposition . [1] Fever AND [2] no signs of serious infection or localizing symptoms (all other triage questions negative)  Answer Assessment - Initial Assessment Questions 1. TEMPERATURE: "What is the most recent temperature?"  "How was it measured?"      99.7 2. ONSET: "When did the fever start?"      Last couple hours 3. SYMPTOMS: "Do you have any other symptoms besides the fever?"  (e.g., colds, headache, sore throat, earache, cough, rash, diarrhea, vomiting, abdominal pain)     no 4. CAUSE: If there are no symptoms, ask: "What do you think is causing the fever?"      unsure 5. CONTACTS: "Does anyone else in the family have an infection?"    no 6. TREATMENT: "What have you done so far to treat this fever?" (e.g., medications)     ibuprofen 7. IMMUNOCOMPROMISE: "Do you have of the following: diabetes, HIV positive, splenectomy, cancer chemotherapy, chronic steroid treatment, transplant patient, etc."    No 8. PREGNANCY: "Is there any chance you are pregnant?" "When was your last menstrual period?"    No menopause 9. TRAVEL: "Have you traveled out of the country in the last month?" (e.g., travel history, exposures)     No  Protocols used: FEVER-A-AH

## 2019-01-11 NOTE — Telephone Encounter (Signed)
Please call pt and confirm she is doing ok.  Let her know that the referral has been placed to Dr Tiffany Kocher.

## 2019-01-11 NOTE — Telephone Encounter (Signed)
Submitted to [facility] for [place] as urgent. [employer]

## 2019-01-11 NOTE — Telephone Encounter (Signed)
Notify pt that the CT was a CT of her abdomen and pelvis.  The only comment radiology made was "Reproductive: No mass or other abnormality" noted.  Unable to say if endometriosis - on CT.  Glad she is feeling better.  Let me know if needs anything.

## 2019-01-11 NOTE — Telephone Encounter (Signed)
Called patient. Stated she was feeling fine today. Pt was also wanted to know if he CT would show her ovaries or if there was a way to tell if she has endometriosis by looking at CT scan.

## 2019-01-12 NOTE — Telephone Encounter (Signed)
Spoke with patient to let her know that we are unable to see endometriosis. She sees GI today and will call back once she is seen by them if needed

## 2019-01-18 ENCOUNTER — Other Ambulatory Visit: Payer: Self-pay | Admitting: Internal Medicine

## 2019-01-31 LAB — HM COLONOSCOPY

## 2019-02-03 ENCOUNTER — Ambulatory Visit: Payer: Managed Care, Other (non HMO)

## 2019-02-07 ENCOUNTER — Telehealth: Payer: Self-pay | Admitting: *Deleted

## 2019-02-07 NOTE — Telephone Encounter (Signed)
Please check with pt and see where she is planning to have her labs drawn.  Thanks

## 2019-02-07 NOTE — Telephone Encounter (Signed)
[facility] is going to [facility] to have labs drawn.

## 2019-02-07 NOTE — Telephone Encounter (Signed)
Can we use the lab orders from 08/27/18 - I put these in for [facility] - resulting agency or do I need to reenter.  Just let me know.

## 2019-02-07 NOTE — Telephone Encounter (Signed)
They are ordered for pt to go to [place]. If she is coming here they have to be ordered future. I will also forward this to Trish to see if patient plans to go to [place] or come here for her labs.

## 2019-02-07 NOTE — Telephone Encounter (Signed)
Please place future orders for lab appt.  I believe she is a [facility] employee also.

## 2019-02-15 ENCOUNTER — Other Ambulatory Visit: Payer: Managed Care, Other (non HMO)

## 2019-03-14 ENCOUNTER — Other Ambulatory Visit: Payer: Self-pay | Admitting: Internal Medicine

## 2019-03-16 NOTE — Telephone Encounter (Signed)
Lab orders are in for [facility]

## 2019-03-16 NOTE — Telephone Encounter (Signed)
Pt is planning to go to [facility] on [address], please send orders there  [phone] She wants to go tomorrow

## 2019-03-17 ENCOUNTER — Telehealth: Payer: Self-pay | Admitting: *Deleted

## 2019-03-17 NOTE — Telephone Encounter (Signed)
It looks like her labs were ordered for [facility] in 07/2018?

## 2019-03-17 NOTE — Telephone Encounter (Signed)
Copied from [place]. Topic: General - Other >> Mar 17, 2019  8:38 AM Burchel, Abbi R wrote: Reason for CRM: Pt states she went to the [facility] this morning tohave her labs drawn and they did not have orders for her.  Please check orders and call pt when they are placed. Pt will be out of town next week and will have labs done when she returns.   Pt: [phone]

## 2019-03-21 NOTE — Telephone Encounter (Signed)
Her labs are in. I have printed the requisition for you to sign and send over. I am not sure why her labs aren't showing. I have left pt a message as well

## 2019-03-21 NOTE — Telephone Encounter (Signed)
Orders signed.  Let me know if I need to re order.  Also, let pt know - not sure why not visualized by [place] for the inconvenience.  Please make sure to talk to pt.

## 2019-03-22 NOTE — Telephone Encounter (Signed)
LMTCB

## 2019-03-27 NOTE — Telephone Encounter (Signed)
Pt was out of town and returning [place] call

## 2019-03-27 NOTE — Telephone Encounter (Signed)
Called patient to let her know that orders were in. She said she wasn't sure if those were the correct ones. Pt stated that she would go Thursday or Wednesday to have them drawn

## 2019-04-01 LAB — CBC WITH DIFFERENTIAL/PLATELET
Basophils Absolute: 0.1 10*3/uL (ref 0.0–0.2)
Basos: 1 %
EOS (ABSOLUTE): 0.3 10*3/uL (ref 0.0–0.4)
Eos: 5 %
Hematocrit: 42 % (ref 34.0–46.6)
Hemoglobin: 13.7 g/dL (ref 11.1–15.9)
Immature Grans (Abs): 0 10*3/uL (ref 0.0–0.1)
Immature Granulocytes: 0 %
Lymphocytes Absolute: 2 10*3/uL (ref 0.7–3.1)
Lymphs: 32 %
MCH: 28.1 pg (ref 26.6–33.0)
MCHC: 32.6 g/dL (ref 31.5–35.7)
MCV: 86 fL (ref 79–97)
Monocytes Absolute: 0.4 10*3/uL (ref 0.1–0.9)
Monocytes: 7 %
Neutrophils Absolute: 3.5 10*3/uL (ref 1.4–7.0)
Neutrophils: 55 %
Platelets: 209 10*3/uL (ref 150–450)
RBC: 4.88 x10E6/uL (ref 3.77–5.28)
RDW: 12.8 % (ref 11.7–15.4)
WBC: 6.2 10*3/uL (ref 3.4–10.8)

## 2019-04-01 LAB — BASIC METABOLIC PANEL
BUN/Creatinine Ratio: 19 (ref 9–23)
BUN: 15 mg/dL (ref 6–24)
CO2: 23 mmol/L (ref 20–29)
Calcium: 9.9 mg/dL (ref 8.7–10.2)
Chloride: 101 mmol/L (ref 96–106)
Creatinine, Ser: 0.77 mg/dL (ref 0.57–1.00)
GFR calc Af Amer: 105 mL/min/{1.73_m2} (ref >59)
GFR calc non Af Amer: 91 mL/min/{1.73_m2} (ref >59)
Glucose: 107 mg/dL — ABNORMAL HIGH (ref 65–99)
Potassium: 4.4 mmol/L (ref 3.5–5.2)
Sodium: 142 mmol/L (ref 134–144)

## 2019-04-01 LAB — TSH: TSH: 4.06 u[IU]/mL (ref 0.450–4.500)

## 2019-04-01 LAB — HEPATIC FUNCTION PANEL
ALT: 81 IU/L — ABNORMAL HIGH (ref 0–32)
AST: 58 IU/L — ABNORMAL HIGH (ref 0–40)
Albumin: 4.7 g/dL (ref 3.8–4.8)
Alkaline Phosphatase: 83 IU/L (ref 39–117)
Bilirubin Total: 0.6 mg/dL (ref 0.0–1.2)
Bilirubin, Direct: 0.17 mg/dL (ref 0.00–0.40)
Total Protein: 6.7 g/dL (ref 6.0–8.5)

## 2019-04-01 LAB — LIPID PANEL
Chol/HDL Ratio: 3.8 ratio (ref 0.0–4.4)
Cholesterol, Total: 188 mg/dL (ref 100–199)
HDL: 49 mg/dL (ref >39)
LDL Calculated: 108 mg/dL — ABNORMAL HIGH (ref 0–99)
Triglycerides: 155 mg/dL — ABNORMAL HIGH (ref 0–149)
VLDL Cholesterol Cal: 31 mg/dL (ref 5–40)

## 2019-04-01 LAB — HEMOGLOBIN A1C
Est. average glucose Bld gHb Est-mCnc: 105 mg/dL
Hgb A1c MFr Bld: 5.3 % (ref 4.8–5.6)

## 2019-04-01 LAB — VITAMIN D 25 HYDROXY (VIT D DEFICIENCY, FRACTURES): Vit D, 25-Hydroxy: 46.5 ng/mL (ref 30.0–100.0)

## 2019-04-04 ENCOUNTER — Other Ambulatory Visit: Payer: Self-pay | Admitting: Internal Medicine

## 2019-04-04 NOTE — Progress Notes (Signed)
Opened in error

## 2019-04-05 NOTE — Addendum Note (Signed)
Addended by: Alisa Graff on: 04/05/2019 08:59 PM   Modules accepted: Orders

## 2019-04-11 ENCOUNTER — Encounter: Payer: Self-pay | Admitting: Internal Medicine

## 2019-04-11 ENCOUNTER — Other Ambulatory Visit: Payer: Self-pay

## 2019-04-11 ENCOUNTER — Ambulatory Visit (INDEPENDENT_AMBULATORY_CARE_PROVIDER_SITE_OTHER): Payer: Managed Care, Other (non HMO) | Admitting: Internal Medicine

## 2019-04-11 DIAGNOSIS — K2100 Gastro-esophageal reflux disease with esophagitis, without bleeding: Secondary | ICD-10-CM

## 2019-04-11 DIAGNOSIS — Z1231 Encounter for screening mammogram for malignant neoplasm of breast: Secondary | ICD-10-CM

## 2019-04-11 DIAGNOSIS — Z9109 Other allergy status, other than to drugs and biological substances: Secondary | ICD-10-CM | POA: Diagnosis not present

## 2019-04-11 DIAGNOSIS — F439 Reaction to severe stress, unspecified: Secondary | ICD-10-CM | POA: Diagnosis not present

## 2019-04-11 DIAGNOSIS — Z1239 Encounter for other screening for malignant neoplasm of breast: Secondary | ICD-10-CM

## 2019-04-11 NOTE — Progress Notes (Signed)
Patient ID: Tasha Woods, female   DOB: 1971/05/28, 48 y.o.   MRN: [id number]   Virtual Visit via video Note  This visit type was conducted due to national recommendations for restrictions regarding the COVID-19 pandemic (e.g. social distancing).  This format is felt to be most appropriate for this patient at this time.  All issues noted in this document were discussed and addressed.  No physical exam was performed (except for noted visual exam findings with Video Visits).   I connected with Tasha Woods by a video enabled telemedicine application and verified that I am speaking with the correct person using two identifiers. Location patient: home Location provider: work  Persons participating in the virtual visit: patient, provider  I discussed the limitations, risks, security and privacy concerns of performing an evaluation and management service by video and the availability of in person appointments.  The patient expressed understanding and agreed to proceed.   Reason for visit:  Scheduled follow up.    HPI: She reports she is doing relatively well.  Saw GI.  S/p EGD and colonoscopy.  Recommended pepcid and PPI and f/u in 3 months.  No significant acid reflux.  No swallowing problems.  No chest pain.  No sob. No abdominal pain.  Bowels moving.  Handling stress.  Overall she fels good.  Taking precautions for covid.    ROS: See pertinent positives and negatives per HPI.  Past Medical History:  Diagnosis Date  . Allergy   . Anxiety   . Asthma   . GERD (gastroesophageal reflux disease)     Past Surgical History:  Procedure Laterality Date  . CHOLECYSTECTOMY  06/08/14  . COLONOSCOPY  04-30-14   Dr Vira Agar  . INNER EAR SURGERY     age 44  . UPPER GI ENDOSCOPY  04-30-14   Dr Vira Agar    Family History  Problem Relation Age of Onset  . Colon polyps Mother   . Hypertension Father   . Breast cancer Neg Hx   . Colon cancer Neg Hx     SOCIAL HX: reviewed.    Current Outpatient  Medications:  .  omeprazole (PRILOSEC) 40 MG capsule, Take by mouth., Disp: , Rfl:  .  citalopram (CELEXA) 20 MG tablet, TAKE 1 TABLET BY MOUTH  DAILY, Disp: 90 tablet, Rfl: 1 .  fluticasone (FLONASE) 50 MCG/ACT nasal spray, USE 2 SPRAYS IN EACH  NOSTRIL DAILY, Disp: 48 g, Rfl: 1 .  fluticasone-salmeterol (ADVAIR HFA) 230-21 MCG/ACT inhaler, USE 2 PUFFS TWO TIMES DAILY, Disp: 36 g, Rfl: 3 .  montelukast (SINGULAIR) 10 MG tablet, TAKE 1 TABLET BY MOUTH AT  BEDTIME, Disp: 90 tablet, Rfl: 3 .  naproxen sodium (ALEVE) 220 MG tablet, Take 220 mg by mouth., Disp: , Rfl:  .  Respiratory Therapy Supplies (FLUTTER) DEVI, Use 10-15 times daily, Disp: 1 each, Rfl: 0 .  traMADol (ULTRAM) 50 MG tablet, Take 1 tablet (50 mg total) by mouth every 6 (six) hours as needed for moderate pain., Disp: 30 tablet, Rfl: 0  EXAM:  GENERAL: alert, oriented, appears well and in no acute distress  HEENT: atraumatic, conjunttiva clear, no obvious abnormalities on inspection of external nose and ears  NECK: normal movements of the head and neck  LUNGS: on inspection no signs of respiratory distress, breathing rate appears normal, no obvious gross SOB, gasping or wheezing  CV: no obvious cyanosis  PSYCH/NEURO: pleasant and cooperative, no obvious depression or anxiety, speech and thought processing grossly intact  ASSESSMENT  AND PLAN:  Discussed the following assessment and plan:  Environmental allergies Controlled.    Reflux esophagitis Saw GI.  S/p EGD and colonoscopy as outlined.  Doing better on current regimen.  Follow.  Continue f/u with GI.    Stress On citalopram.  Doing well.  Follow.    Breast cancer screening She will schedule mammogram.     I discussed the assessment and treatment plan with the patient. The patient was provided an opportunity to ask questions and all were answered. The patient agreed with the plan and demonstrated an understanding of the instructions.   The patient was  advised to call back or seek an in-person evaluation if the symptoms worsen or if the condition fails to improve as anticipated.    Einar Pheasant, MD

## 2019-04-16 ENCOUNTER — Encounter: Payer: Self-pay | Admitting: Internal Medicine

## 2019-04-16 DIAGNOSIS — Z1239 Encounter for other screening for malignant neoplasm of breast: Secondary | ICD-10-CM | POA: Insufficient documentation

## 2019-04-16 NOTE — Assessment & Plan Note (Signed)
She will schedule mammogram.

## 2019-04-16 NOTE — Assessment & Plan Note (Signed)
On citalopram.  Doing well.  Follow.

## 2019-04-16 NOTE — Assessment & Plan Note (Signed)
Saw GI.  S/p EGD and colonoscopy as outlined.  Doing better on current regimen.  Follow.  Continue f/u with GI.

## 2019-04-16 NOTE — Assessment & Plan Note (Signed)
Controlled.  

## 2019-04-20 DIAGNOSIS — K529 Noninfective gastroenteritis and colitis, unspecified: Secondary | ICD-10-CM | POA: Insufficient documentation

## 2019-04-20 DIAGNOSIS — R748 Abnormal levels of other serum enzymes: Secondary | ICD-10-CM | POA: Insufficient documentation

## 2019-04-20 DIAGNOSIS — K269 Duodenal ulcer, unspecified as acute or chronic, without hemorrhage or perforation: Secondary | ICD-10-CM | POA: Insufficient documentation

## 2019-04-20 LAB — HM HEPATITIS C SCREENING LAB: HM Hepatitis Screen: NEGATIVE

## 2019-05-01 ENCOUNTER — Other Ambulatory Visit: Payer: Self-pay | Admitting: Internal Medicine

## 2019-05-02 ENCOUNTER — Other Ambulatory Visit: Payer: Self-pay

## 2019-05-16 ENCOUNTER — Ambulatory Visit
Admission: RE | Admit: 2019-05-16 | Discharge: 2019-05-16 | Disposition: A | Discharge status: Home / Self Care | Payer: Managed Care, Other (non HMO) | Source: Ambulatory Visit | Attending: Internal Medicine | Admitting: Internal Medicine

## 2019-05-16 DIAGNOSIS — Z1231 Encounter for screening mammogram for malignant neoplasm of breast: Secondary | ICD-10-CM | POA: Insufficient documentation

## 2019-06-15 ENCOUNTER — Encounter: Payer: Self-pay | Admitting: Internal Medicine

## 2019-06-15 MED ORDER — SCOPOLAMINE 1 MG/3DAYS TD PT72: 1.0000 | MEDICATED_PATCH | TRANSDERMAL | 0 refills | Status: DC

## 2019-06-15 NOTE — Telephone Encounter (Signed)
rx sent in for scopolamine patches.

## 2019-07-26 DIAGNOSIS — M7670 Peroneal tendinitis, unspecified leg: Secondary | ICD-10-CM | POA: Insufficient documentation

## 2019-08-11 ENCOUNTER — Other Ambulatory Visit: Payer: Self-pay | Admitting: Internal Medicine

## 2019-08-16 ENCOUNTER — Encounter: Payer: Self-pay | Admitting: Internal Medicine

## 2019-08-17 MED ORDER — OMEPRAZOLE 40 MG PO CPDR: 40.0000 mg | DELAYED_RELEASE_CAPSULE | Freq: Every day | ORAL | 1 refills | Status: DC

## 2019-08-17 NOTE — Telephone Encounter (Signed)
rx sent in for omeprazole #90 with one refill.

## 2019-08-17 NOTE — Telephone Encounter (Signed)
Are you ok to fill omeprazole 40 mg q day. Previously prescribed by GI md who has retired.

## 2019-10-29 ENCOUNTER — Ambulatory Visit: Payer: Managed Care, Other (non HMO) | Attending: Internal Medicine

## 2019-10-29 DIAGNOSIS — Z23 Encounter for immunization: Secondary | ICD-10-CM

## 2019-10-29 NOTE — Progress Notes (Signed)
   Covid-19 Vaccination Clinic  Name:  Tasha Woods    MRN: [id number] DOB: 25-Feb-1971  10/29/2019  [name] was observed post Covid-19 immunization for 15 minutes without incidence. She was provided with Vaccine Information Sheet and instruction to access the V-Safe system.   [name] was instructed to call 911 with any severe reactions post vaccine: Marland Kitchen Difficulty breathing  . Swelling of your face and throat  . A fast heartbeat  . A bad rash all over your body  . Dizziness and weakness    Immunizations Administered    Name Date Dose VIS Date Route   Pfizer COVID-19 Vaccine 10/29/2019  2:04 PM 0.3 mL 08/11/2019 Intramuscular   Manufacturer: [place]   Lot: [id number]   [place]: [id number]

## 2019-11-21 ENCOUNTER — Ambulatory Visit: Payer: Managed Care, Other (non HMO) | Attending: Internal Medicine

## 2019-11-21 DIAGNOSIS — Z23 Encounter for immunization: Secondary | ICD-10-CM

## 2019-11-21 NOTE — Progress Notes (Signed)
   Covid-19 Vaccination Clinic  Name:  Tasha Woods    MRN: [id number] DOB: 1971/03/19  11/21/2019  [name] was observed post Covid-19 immunization for 15 minutes without incident. She was provided with Vaccine Information Sheet and instruction to access the V-Safe system.   [name] was instructed to call 911 with any severe reactions post vaccine: Marland Kitchen Difficulty breathing  . Swelling of face and throat  . A fast heartbeat  . A bad rash all over body  . Dizziness and weakness   Immunizations Administered    Name Date Dose VIS Date Route   Pfizer COVID-19 Vaccine 11/21/2019  1:51 PM 0.3 mL 08/11/2019 Intramuscular   Manufacturer: [employer]   Lot: [id number]   [place]: [id number]

## 2019-12-18 ENCOUNTER — Other Ambulatory Visit: Payer: Self-pay

## 2019-12-18 ENCOUNTER — Encounter: Payer: Self-pay | Admitting: Internal Medicine

## 2019-12-18 MED ORDER — OMEPRAZOLE 40 MG PO CPDR: 40.0000 mg | DELAYED_RELEASE_CAPSULE | Freq: Every day | ORAL | 1 refills | Status: DC

## 2020-02-05 ENCOUNTER — Other Ambulatory Visit: Payer: Self-pay | Admitting: Internal Medicine

## 2020-03-15 ENCOUNTER — Other Ambulatory Visit: Payer: Self-pay | Admitting: Internal Medicine

## 2020-04-14 ENCOUNTER — Other Ambulatory Visit: Payer: Self-pay | Admitting: Internal Medicine

## 2020-04-15 ENCOUNTER — Encounter: Payer: Self-pay | Admitting: Podiatry

## 2020-04-15 ENCOUNTER — Other Ambulatory Visit: Payer: Self-pay

## 2020-04-15 ENCOUNTER — Ambulatory Visit: Payer: Managed Care, Other (non HMO) | Admitting: Podiatry

## 2020-04-15 ENCOUNTER — Other Ambulatory Visit: Payer: Self-pay | Admitting: Internal Medicine

## 2020-04-15 ENCOUNTER — Ambulatory Visit (INDEPENDENT_AMBULATORY_CARE_PROVIDER_SITE_OTHER): Payer: Managed Care, Other (non HMO)

## 2020-04-15 DIAGNOSIS — M7672 Peroneal tendinitis, left leg: Secondary | ICD-10-CM

## 2020-04-15 DIAGNOSIS — Q6672 Congenital pes cavus, left foot: Secondary | ICD-10-CM | POA: Diagnosis not present

## 2020-04-15 DIAGNOSIS — S9305XA Dislocation of left ankle joint, initial encounter: Secondary | ICD-10-CM | POA: Diagnosis not present

## 2020-04-15 DIAGNOSIS — IMO0001 Reserved for inherently not codable concepts without codable children: Secondary | ICD-10-CM

## 2020-04-15 DIAGNOSIS — Z1231 Encounter for screening mammogram for malignant neoplasm of breast: Secondary | ICD-10-CM

## 2020-04-15 NOTE — Progress Notes (Signed)
  Subjective:  Patient ID: KISSIE ZIOLKOWSKI, female    DOB: 12-16-1970,  MRN: [id number]  Chief Complaint  Patient presents with  . Ankle Pain    Patient presents today for left lateral ankle pain and swelling x 8 months.  She says the pain is worse when walking and it throbs at night.  She recieved a cortisone injection from [facility] in March and it helped some.      49 y.o. female presents with the above complaint. History confirmed with patient.  Something going on for some time.  She has an ankle brace that she received from [facility] for this, this has not been really helpful.  She also has custom orthotics from [place] that she has not worn because they do not fit in her shoes.  She states she feels like the "ankle needs to pop it will not".  Objective:  Physical Exam: warm, good capillary refill, no trophic changes or ulcerative lesions,  normal DP and PT pulses and normal sensory exam. Left foot: Pain along the peroneal tendons from the fifth metatarsal base to just above the retromalleolar area, these are dislocating and subluxating with circular range of motion of the ankle.  She has pain with resisted range of motion.  There is edema in the area.  No gross ankle instability and negative anterior drawer and talar tilt test.  Radiographs: X-ray of left ankle: no fracture, dislocation, swelling or degenerative changes noted, mild pes cavus foot type is noted Assessment:   1. Peroneal tendinitis of left lower leg   2. Subluxation of peroneal tendon of left foot, initial encounter   3. Pes cavus of left foot      Plan:  Patient was evaluated and treated and all questions answered.   -Discussed etiology and treatment options for peroneal tendinitis, subluxation of the peroneal tendons and how this relates to her overall pes cavus foot type.  We discussed surgical nonsurgical treatment options for this.  I recommend that we continue nonsurgical treatment currently.   -She will  continue her meloxicam, I recommended Voltaren gel application 3-4 times daily, and we escalate her immobilization into a short cam boot for the next 4 weeks.  I would like her to rest as much as possible for the next 2 weeks icing and elevating after activity, I recommended that she avoid weightbearing activity and exercise on the left lower extremity for now.  In 2 weeks I would like her to begin stretching rehab exercises if she is able.  Follow-up in 4 to 6 weeks to recheck this, if she is not improving we will consider physical therapy. -  Also discussed that if she is not eventually better with nonsurgical treatment that operative treatment with repair of the peroneal tendons, deepening of the fibular peroneal groove, and possible treatment as a relates to her pes cavus deformity may be beneficial.  Lanae Crumbly, DPM 04/15/2020     Return in about 4 weeks (around 05/13/2020) for re-check peroneal tendinitis.

## 2020-04-15 NOTE — Patient Instructions (Signed)
Peroneal Tendinopathy Rehab  Begin these exercises in 2 weeks. Continue to ice and rest as much as possible, you may bear weight in the CAM boot. Avoid exercise on the left leg for now. Apply Voltaren anti inflammatory gel 3-4x daily  Ask your health care provider which exercises are safe for you. Do exercises exactly as told by your health care provider and adjust them as directed. It is normal to feel mild stretching, pulling, tightness, or discomfort as you do these exercises. Stop right away if you feel sudden pain or your pain gets worse. Do not begin these exercises until told by your health care provider. Stretching and range-of-motion exercises These exercises warm up your muscles and joints and improve the movement and flexibility of your ankle. These exercises also help to relieve pain and stiffness. Gastroc and soleus stretch, standing  This is an exercise in which you stand on a step and use your body weight to stretch your calf muscles. To do this exercise: 1. Stand on the edge of a step on the ball of your left / right foot. The ball of your foot is on the walking surface, right under your toes. 2. Keep your other foot firmly on the same step. 3. Hold on to the wall, a railing, or a chair for balance. 4. Slowly lift your other foot, allowing your body weight to press your left / right heel down over the edge of the step. You should feel a stretch in your left / right calf (gastrocnemius and soleus). 5. Hold this position for 15 seconds. 6. Return both feet to the step. 7. Repeat this exercise with a slight bend in your left / right knee. Repeat 5 times with your left / right knee straight and 5 times with your left / right knee bent. Complete this exercise 2 times a day. Strengthening exercises These exercises build strength and endurance in your foot and ankle. Endurance is the ability to use your muscles for a long time, even after they get tired. Ankle dorsiflexion with  band   1. Secure a rubber exercise band or tube to an object, such as a table leg, that will not move when the band is pulled. 2. Secure the other end of the band around your left / right foot. 3. Sit on the floor, facing the object with your left / right leg extended. The band or tube should be slightly tense when your foot is relaxed. 4. Slowly flex your left / right ankle and toes to bring your foot toward you (dorsiflexion). 5. Hold this position for 15 seconds. 6. Let the band or tube slowly pull your foot back to the starting position. Repeat 5 times. Complete this exercise 2 times a day. Ankle eversion 1. Sit on the floor with your legs straight out in front of you. 2. Loop a rubber exercise band or tube around the ball of your left / right foot. The ball of your foot is on the walking surface, right under your toes. 3. Hold the ends of the band in your hands, or secure the band to a stable object. The band or tube should be slightly tense when your foot is relaxed. 4. Slowly push your foot outward, away from your other leg (eversion). 5. Hold this position for 15 seconds. 6. Slowly return your foot to the starting position. Repeat 5 times. Complete this exercise 2 times a day. Plantar flexion, standing  This exercise is sometimes called standing heel raise. 1.  Stand with your feet shoulder-width apart. 2. Place your hands on a wall or table to steady yourself as needed, but try not to use it for support. 3. Keep your weight spread evenly over the width of your feet while you slowly rise up on your toes (plantar flexion). If told by your health care provider: ? Shift your weight toward your left / right leg until you feel challenged. ? Stand on your left / right leg only. 4. Hold this position for 15 seconds. Repeat 2 times. Complete this exercise 2 times a day. Single leg stand 1. Without shoes, stand near a railing or in a doorway. You may hold on to the railing or door frame as  needed. 2. Stand on your left / right foot. Keep your big toe down on the floor and try to keep your arch lifted. ? Do not roll to the outside of your foot. ? If this exercise is too easy, you can try it with your eyes closed or while standing on a pillow. 3. Hold this position for 15 seconds. Repeat 5 times. Complete this exercise 2 times a day. This information is not intended to replace advice given to you by your health care provider. Make sure you discuss any questions you have with your health care provider. Document Revised: 12/06/2018 Document Reviewed: 12/06/2018 Elsevier Patient Education  [place].

## 2020-05-09 ENCOUNTER — Encounter: Payer: Self-pay | Admitting: Internal Medicine

## 2020-05-13 ENCOUNTER — Ambulatory Visit: Payer: Managed Care, Other (non HMO) | Admitting: Podiatry

## 2020-06-18 ENCOUNTER — Other Ambulatory Visit: Payer: Self-pay | Admitting: Internal Medicine

## 2020-06-19 ENCOUNTER — Encounter: Payer: Self-pay | Admitting: Internal Medicine

## 2020-06-20 ENCOUNTER — Other Ambulatory Visit: Payer: Self-pay

## 2020-06-20 ENCOUNTER — Encounter: Payer: Self-pay | Admitting: Internal Medicine

## 2020-06-20 ENCOUNTER — Telehealth (INDEPENDENT_AMBULATORY_CARE_PROVIDER_SITE_OTHER): Payer: Managed Care, Other (non HMO) | Admitting: Internal Medicine

## 2020-06-20 VITALS — Temp 98.1°F | Ht 63.0 in | Wt 178.0 lb

## 2020-06-20 DIAGNOSIS — R053 Chronic cough: Secondary | ICD-10-CM

## 2020-06-20 DIAGNOSIS — F439 Reaction to severe stress, unspecified: Secondary | ICD-10-CM | POA: Diagnosis not present

## 2020-06-20 DIAGNOSIS — Z0289 Encounter for other administrative examinations: Secondary | ICD-10-CM

## 2020-06-20 DIAGNOSIS — R059 Cough, unspecified: Secondary | ICD-10-CM

## 2020-06-20 MED ORDER — BENZONATATE 100 MG PO CAPS
100.0000 mg | ORAL_CAPSULE | Freq: Three times a day (TID) | ORAL | 0 refills | Status: DC | PRN
Start: 2020-06-20 — End: 2020-10-09

## 2020-06-20 MED ORDER — ADVAIR HFA 230-21 MCG/ACT IN AERO: INHALATION_SPRAY | RESPIRATORY_TRACT | 1 refills | Status: DC

## 2020-06-20 NOTE — Telephone Encounter (Signed)
Last refill on Advair 2019. Form placed in quick sign for your signature.

## 2020-06-20 NOTE — Telephone Encounter (Signed)
Pt scheduled  

## 2020-06-20 NOTE — Telephone Encounter (Signed)
I assume the form is appeal  for BMI. In reviewing, it appears that I have not seen her in over one year.  I need something relatively recent documented that we have met and discussed diet, exercise and weight loss.  Ok to schedule virtual at 5 today.

## 2020-06-23 ENCOUNTER — Encounter: Payer: Self-pay | Admitting: Internal Medicine

## 2020-06-23 DIAGNOSIS — Z0289 Encounter for other administrative examinations: Secondary | ICD-10-CM | POA: Insufficient documentation

## 2020-06-23 NOTE — Assessment & Plan Note (Signed)
With cough and congestion as outlined.  No fever.  Able to talk complete sentences without being sob, etc.  Restart advair on a daily basis (bid).  Albuterol inhaler if needed.  Tessalon perles as directed.  Robitussin.  Check cxr.  Hold abx.  Follow.  Notify or be evaluated if any symptom change or worsening problems.

## 2020-06-23 NOTE — Progress Notes (Signed)
Patient ID: Tasha Woods, female   DOB: Apr 28, 1971, 49 y.o.   MRN: [id number]   Virtual Visit via video Note  This visit type was conducted due to national recommendations for restrictions regarding the COVID-19 pandemic (e.g. social distancing).  This format is felt to be most appropriate for this patient at this time.  All issues noted in this document were discussed and addressed.  No physical exam was performed (except for noted visual exam findings with Video Visits).   I connected with Tasha Woods by a video enabled telemedicine application and verified that I am speaking with the correct person using two identifiers. Location patient: home Location provider: work  Persons participating in the virtual visit: patient, provider  The limitations, risks, security and privacy concerns of performing an evaluation and management service by video and the availability of in person appointments have been discussed.  It has also been discussed with the patient that there may be a patient responsible charge related to this service. The patient expressed understanding and agreed to proceed.   Reason for visit: work in appt  HPI: Work in appt for form completion.  Needs appeal form completed - not reaching BMI goal.  She is working from home.  Increased stress.  New job.  Some increased stress with her mother's health issues as well.  Taking celexa - now on 1 1/2 tablet per day.  On this dose since 03/2020.  Feels better on this dose.  Feels this is working well for her.  She tries to stay active.  Works in her yard.  Discussed diet and exercise.  Plans to start exercising more.  Does report noticing increased cough - worse in evening and early am.  Cold 5 weeks ago.  No sinus pressure.  Now with cough - productive.  Has noticed blood tinge - in sputum - when coughing hard.  No gross hemoptysis.  Some discomfort with coughing in her chest - some sob with cough.  Uses her albuterol inhaler - helps.  Has not  been using advair.  No acid reflux.  No chest pain reported with increased activity or exertion.  No vomiting or diarrhea.    ROS: See pertinent positives and negatives per HPI.  Past Medical History:  Diagnosis Date  . Allergy   . Anxiety   . Asthma   . GERD (gastroesophageal reflux disease)     Past Surgical History:  Procedure Laterality Date  . CHOLECYSTECTOMY  06/08/14  . COLONOSCOPY  04-30-14   Dr Vira Agar  . INNER EAR SURGERY     age 35  . UPPER GI ENDOSCOPY  04-30-14   Dr Vira Agar    Family History  Problem Relation Age of Onset  . Colon polyps Mother   . Hypertension Father   . Breast cancer Neg Hx   . Colon cancer Neg Hx     SOCIAL HX: reviewed.    Current Outpatient Medications:  .  albuterol (VENTOLIN HFA) 108 (90 Base) MCG/ACT inhaler, Inhale into the lungs., Disp: , Rfl:  .  citalopram (CELEXA) 20 MG tablet, TAKE 1 TABLET BY MOUTH  DAILY, Disp: 90 tablet, Rfl: 3 .  fluticasone (FLONASE) 50 MCG/ACT nasal spray, USE 2 SPRAYS IN BOTH  NOSTRILS DAILY, Disp: 48 g, Rfl: 1 .  fluticasone-salmeterol (ADVAIR HFA) 230-21 MCG/ACT inhaler, USE 2 PUFFS TWO TIMES DAILY, Disp: 36 g, Rfl: 1 .  meloxicam (MOBIC) 15 MG tablet, meloxicam 15 mg tablet, Disp: , Rfl:  .  montelukast (SINGULAIR)  10 MG tablet, TAKE 1 TABLET BY MOUTH AT  BEDTIME, Disp: 90 tablet, Rfl: 3 .  naproxen sodium (ALEVE) 220 MG tablet, Take 220 mg by mouth., Disp: , Rfl:  .  omeprazole (PRILOSEC) 40 MG capsule, TAKE 1 CAPSULE BY MOUTH  DAILY, Disp: 90 capsule, Rfl: 3 .  benzonatate (TESSALON PERLES) 100 MG capsule, Take 1 capsule (100 mg total) by mouth 3 (three) times daily as needed for cough., Disp: 21 capsule, Rfl: 0  EXAM:  GENERAL: alert, oriented, appears well and in no acute distress  HEENT: atraumatic, conjunttiva clear, no obvious abnormalities on inspection of external nose and ears  NECK: normal movements of the head and neck  LUNGS: on inspection no signs of respiratory distress, breathing  rate appears normal, no obvious gross SOB, gasping or wheezing  CV: no obvious cyanosis  PSYCH/NEURO: pleasant and cooperative, no obvious depression or anxiety, speech and thought processing grossly intact  ASSESSMENT AND PLAN:  Discussed the following assessment and plan:  Problem List Items Addressed This Visit    Stress    Increased stress as outlined.  Discussed with her today.  On citalopram. Doing well on current dose.  Follow.  Continue 1 1/2 tablet per day.        Encounter for completion of form with patient    Work form completed and emailed to pt during visit.  She received copy of form.        Cough    With cough and congestion as outlined.  No fever.  Able to talk complete sentences without being sob, etc.  Restart advair on a daily basis (bid).  Albuterol inhaler if needed.  Tessalon perles as directed.  Robitussin.  Check cxr.  Hold abx.  Follow.  Notify or be evaluated if any symptom change or worsening problems.         Other Visit Diagnoses    Persistent cough    -  Primary   Relevant Orders   DG Chest 2 View       I discussed the assessment and treatment plan with the patient. The patient was provided an opportunity to ask questions and all were answered. The patient agreed with the plan and demonstrated an understanding of the instructions.   The patient was advised to call back or seek an in-person evaluation if the symptoms worsen or if the condition fails to improve as anticipated.    Einar Pheasant, MD

## 2020-06-23 NOTE — Assessment & Plan Note (Signed)
Work form completed and emailed to pt during visit.  She received copy of form.

## 2020-06-23 NOTE — Assessment & Plan Note (Signed)
Increased stress as outlined.  Discussed with her today.  On citalopram. Doing well on current dose.  Follow.  Continue 1 1/2 tablet per day.

## 2020-06-24 ENCOUNTER — Other Ambulatory Visit: Payer: Self-pay

## 2020-06-24 ENCOUNTER — Ambulatory Visit
Admission: RE | Admit: 2020-06-24 | Discharge: 2020-06-24 | Disposition: A | Discharge status: Home / Self Care | Payer: Managed Care, Other (non HMO) | Attending: Internal Medicine | Admitting: Internal Medicine

## 2020-06-24 ENCOUNTER — Ambulatory Visit
Admission: RE | Admit: 2020-06-24 | Discharge: 2020-06-24 | Disposition: A | Discharge status: Home / Self Care | Payer: Managed Care, Other (non HMO) | Source: Ambulatory Visit | Attending: Internal Medicine | Admitting: Internal Medicine

## 2020-06-24 DIAGNOSIS — R053 Chronic cough: Secondary | ICD-10-CM | POA: Insufficient documentation

## 2020-06-27 ENCOUNTER — Encounter: Payer: Self-pay | Admitting: Internal Medicine

## 2020-09-03 ENCOUNTER — Telehealth: Payer: Self-pay | Admitting: Internal Medicine

## 2020-09-03 NOTE — Telephone Encounter (Signed)
Needs a physical scheduled with me.  Ok to do 7:30 if needs.  Thanks.

## 2020-09-03 NOTE — Telephone Encounter (Signed)
Called and scheduled pt

## 2020-09-25 ENCOUNTER — Encounter: Payer: Self-pay | Admitting: *Deleted

## 2020-10-09 ENCOUNTER — Encounter: Payer: Self-pay | Admitting: Internal Medicine

## 2020-10-09 ENCOUNTER — Other Ambulatory Visit: Payer: Self-pay

## 2020-10-09 ENCOUNTER — Other Ambulatory Visit: Payer: Self-pay | Admitting: Internal Medicine

## 2020-10-09 ENCOUNTER — Ambulatory Visit (INDEPENDENT_AMBULATORY_CARE_PROVIDER_SITE_OTHER): Payer: Managed Care, Other (non HMO) | Admitting: Internal Medicine

## 2020-10-09 ENCOUNTER — Ambulatory Visit (INDEPENDENT_AMBULATORY_CARE_PROVIDER_SITE_OTHER): Payer: Managed Care, Other (non HMO)

## 2020-10-09 VITALS — BP 124/76 | HR 75 | Temp 97.7°F | Resp 16 | Ht 63.0 in | Wt 182.0 lb

## 2020-10-09 DIAGNOSIS — R059 Cough, unspecified: Secondary | ICD-10-CM

## 2020-10-09 DIAGNOSIS — Z0000 Encounter for general adult medical examination without abnormal findings: Secondary | ICD-10-CM | POA: Diagnosis not present

## 2020-10-09 DIAGNOSIS — K760 Fatty (change of) liver, not elsewhere classified: Secondary | ICD-10-CM | POA: Diagnosis not present

## 2020-10-09 DIAGNOSIS — J9811 Atelectasis: Secondary | ICD-10-CM | POA: Diagnosis not present

## 2020-10-09 DIAGNOSIS — Z13220 Encounter for screening for lipoid disorders: Secondary | ICD-10-CM | POA: Diagnosis not present

## 2020-10-09 DIAGNOSIS — Z114 Encounter for screening for human immunodeficiency virus [HIV]: Secondary | ICD-10-CM

## 2020-10-09 DIAGNOSIS — F439 Reaction to severe stress, unspecified: Secondary | ICD-10-CM

## 2020-10-09 DIAGNOSIS — Z23 Encounter for immunization: Secondary | ICD-10-CM | POA: Diagnosis not present

## 2020-10-09 DIAGNOSIS — E875 Hyperkalemia: Secondary | ICD-10-CM

## 2020-10-09 DIAGNOSIS — K5000 Crohn's disease of small intestine without complications: Secondary | ICD-10-CM

## 2020-10-09 DIAGNOSIS — K2100 Gastro-esophageal reflux disease with esophagitis, without bleeding: Secondary | ICD-10-CM

## 2020-10-09 DIAGNOSIS — Z1231 Encounter for screening mammogram for malignant neoplasm of breast: Secondary | ICD-10-CM

## 2020-10-09 DIAGNOSIS — Z124 Encounter for screening for malignant neoplasm of cervix: Secondary | ICD-10-CM

## 2020-10-09 NOTE — Assessment & Plan Note (Signed)
Cough resolved.  Recheck cxr today.

## 2020-10-09 NOTE — Progress Notes (Signed)
Patient ID: Tasha Woods, female   DOB: May 21, 1971, 50 y.o.   MRN: [id number]   Subjective:    Patient ID: Tasha Woods, female    DOB: [birth year], 50 y.o.   MRN: [id number]  HPI This visit occurred during the SARS-CoV-2 public health emergency.  Safety protocols were in place, including screening questions prior to the visit, additional usage of staff PPE, and extensive cleaning of exam room while observing appropriate contact time as indicated for disinfecting solutions.  Patient here for her physical exam.  She is doing well. Feels good. Overall handling stress well.  Discussed trying to stay active.  No chest pain or sob with increased activity reported.  Discussed diet and exercise.  She has lost weight.  Feels she is doing better.  Evaluated 05/2020 for persistent cough.  Had cxr in 05/2020 that was negative for acute cardiopulmonary disease.  Did comment on new linear atelectasis/scarring at the left lung base.  She reports that her cough has resolved.  Feels breathing at baseline.  No acid reflux.  No abdominal pain reported.  Bowels moving. Previously saw GI.  Recommended TwinRx vacccines.  Discussed with her today.     Past Medical History:  Diagnosis Date  . Allergy   . Anxiety   . Asthma   . GERD (gastroesophageal reflux disease)    Past Surgical History:  Procedure Laterality Date  . CHOLECYSTECTOMY  06/08/14  . COLONOSCOPY  04-30-14   Dr Vira Agar  . INNER EAR SURGERY     age 89  . UPPER GI ENDOSCOPY  04-30-14   Dr Vira Agar   Family History  Problem Relation Age of Onset  . Colon polyps Mother   . Hypertension Father   . Breast cancer Neg Hx   . Colon cancer Neg Hx    Social History   Socioeconomic History  . Marital status: Married    Spouse name: Not on file  . Number of children: 2  . Years of education: Not on file  . Highest education level: Not on file  Occupational History    Employer: [employer]  Tobacco Use  . Smoking status: Never Smoker  . Smokeless  tobacco: Never Used  Vaping Use  . Vaping Use: Never used  Substance and Sexual Activity  . Alcohol use: Yes    Alcohol/week: 0.0 standard drinks    Comment: occasionally  . Drug use: No  . Sexual activity: Yes  Other Topics Concern  . Not on file  Social History Narrative  . Not on file   Social Determinants of Health   Financial Resource Strain: Not on file  Food Insecurity: Not on file  Transportation Needs: Not on file  Physical Activity: Not on file  Stress: Not on file  Social Connections: Not on file    Outpatient Encounter Medications as of 10/09/2020  Medication Sig  . albuterol (VENTOLIN HFA) 108 (90 Base) MCG/ACT inhaler Inhale into the lungs.  . citalopram (CELEXA) 20 MG tablet TAKE 1 TABLET BY MOUTH  DAILY  . fluticasone (FLONASE) 50 MCG/ACT nasal spray USE 2 SPRAYS IN BOTH  NOSTRILS DAILY  . fluticasone-salmeterol (ADVAIR HFA) 230-21 MCG/ACT inhaler USE 2 PUFFS TWO TIMES DAILY  . meloxicam (MOBIC) 15 MG tablet meloxicam 15 mg tablet  . montelukast (SINGULAIR) 10 MG tablet TAKE 1 TABLET BY MOUTH AT  BEDTIME  . omeprazole (PRILOSEC) 40 MG capsule TAKE 1 CAPSULE BY MOUTH  DAILY  . [DISCONTINUED] benzonatate (TESSALON PERLES) 100 MG capsule Take  1 capsule (100 mg total) by mouth 3 (three) times daily as needed for cough.  . [DISCONTINUED] naproxen sodium (ALEVE) 220 MG tablet Take 220 mg by mouth.   No facility-administered encounter medications on file as of 10/09/2020.    Review of Systems  Constitutional: Negative for appetite change and unexpected weight change.  HENT: Negative for congestion, sinus pressure and sore throat.   Eyes: Negative for pain and visual disturbance.  Respiratory: Negative for cough, chest tightness and shortness of breath.   Cardiovascular: Negative for chest pain, palpitations and leg swelling.  Gastrointestinal: Negative for abdominal pain, constipation, diarrhea and vomiting.  Genitourinary: Negative for difficulty urinating and  dysuria.  Musculoskeletal: Negative for back pain and joint swelling.  Skin: Negative for color change and rash.  Neurological: Negative for dizziness, light-headedness and headaches.  Hematological: Negative for adenopathy. Does not bruise/bleed easily.  Psychiatric/Behavioral: Negative for agitation and dysphoric mood.       Objective:    Physical Exam Vitals reviewed.  Constitutional:      General: She is not in acute distress.    Appearance: Normal appearance. She is well-developed and well-nourished.  HENT:     Head: Normocephalic and atraumatic.     Right Ear: External ear normal.     Left Ear: External ear normal.     Mouth/Throat:     Mouth: Oropharynx is clear and moist.  Eyes:     General: No scleral icterus.       Right eye: No discharge.        Left eye: No discharge.     Conjunctiva/sclera: Conjunctivae normal.  Neck:     Thyroid: No thyromegaly.  Cardiovascular:     Rate and Rhythm: Normal rate and regular rhythm.  Pulmonary:     Effort: No tachypnea, accessory muscle usage or respiratory distress.     Breath sounds: Normal breath sounds. No decreased breath sounds or wheezing.  Chest:  Breasts:     Right: No inverted nipple, mass, nipple discharge or tenderness (no axillary adenopathy).     Left: No inverted nipple, mass, nipple discharge or tenderness (no axilarry adenopathy).    Abdominal:     General: Bowel sounds are normal.     Palpations: Abdomen is soft.     Tenderness: There is no abdominal tenderness.  Genitourinary:    Comments: Normal external genitalia.  Vaginal vault without lesions.  Cervix identified.  Pap smear performed.  Could not appreciate any adnexal masses or tenderness.   Musculoskeletal:        General: No swelling, tenderness or edema.     Cervical back: Neck supple. No tenderness.  Lymphadenopathy:     Cervical: No cervical adenopathy.  Skin:    Findings: No erythema or rash.  Neurological:     Mental Status: She is alert  and oriented to person, place, and time.  Psychiatric:        Mood and Affect: Mood and affect and mood normal.        Behavior: Behavior normal.     BP 124/76   Pulse 75   Temp 97.7 F (36.5 C) (Oral)   Resp 16   Ht 5' 3"  (1.6 m)   Wt 182 lb (82.6 kg)   SpO2 98%   BMI 32.24 kg/m  Wt Readings from Last 3 Encounters:  10/09/20 182 lb (82.6 kg)  06/20/20 178 lb (80.7 kg)  11/11/18 196 lb (88.9 kg)     Lab Results  Component Value  Date   WBC 6.4 10/09/2020   HGB 13.1 10/09/2020   HCT 40.3 10/09/2020   PLT 228 10/09/2020   GLUCOSE 104 (H) 10/09/2020   CHOL 176 10/09/2020   TRIG 104 10/09/2020   HDL 51 10/09/2020   LDLCALC 106 (H) 10/09/2020   ALT 35 (H) 10/09/2020   AST 30 10/09/2020   NA 142 10/09/2020   K 4.6 10/09/2020   CL 105 10/09/2020   CREATININE 0.77 10/09/2020   BUN 17 10/09/2020   CO2 23 10/09/2020   TSH 2.670 10/09/2020   HGBA1C 5.3 03/31/2019    DG Chest 2 View  Result Date: 06/24/2020 CLINICAL DATA:  50 year old female with a history of persisting cough EXAM: CHEST - 2 VIEW COMPARISON:  09/18/2016 FINDINGS: Cardiomediastinal silhouette unchanged in size and contour. No evidence of central vascular congestion. No pneumothorax. Compared to the prior plain film there is linear opacity at the left lung base overlying the cardiac silhouette. No confluent airspace disease. No pleural effusion. No displaced fracture. IMPRESSION: Negative for acute cardiopulmonary disease, with new linear atelectasis/scarring at the left lung base. Electronically Signed   By: Corrie Mckusick D.O.   On: 06/24/2020 09:58       Assessment & Plan:   Problem List Items Addressed This Visit    Atelectasis    Cough resolved.  Recheck cxr today.       Relevant Orders   DG Chest 2 View (Completed)   Breast cancer screening    Mammogram ordered.  She will schedule.        Cough    Resolved. Previous cxr as outlined.  Recheck cxr today.        Crohn's disease of small  intestine without complication ([place])    Documented history of crohn's.  Has seen GI.  Bowels stable.  Follow.       Fatty liver disease, nonalcoholic    Saw GI.  Recommended [facility].  Check liver function today.  Has lost weight.       Relevant Orders   CBC with Differential/Platelet (Completed)   Hepatic function panel (Completed)   TSH (Completed)   Basic metabolic panel (Completed)   Health care maintenance    Physical today 10/09/20.  PAP 10/09/20.  Colonoscopy 01/2019.        Reflux esophagitis    Saw GI.  S/p EGD and colonoscopy as outlined.  Doing well on prilosec.  Follow.        Stress    On  Citalopram.  Appears to be doing well.  Continue.  Follow.        Other Visit Diagnoses    Routine general medical examination at a health care facility    -  Primary   Screening cholesterol level       Relevant Orders   Lipid panel (Completed)   Screening for HIV (human immunodeficiency virus)       Relevant Orders   HIV antibody (with reflex) (Completed)   Cervical cancer screening       Relevant Orders   Cytology - PAP( [place])   Need for hepatitis A and B vaccination       Relevant Orders   Hepatitis A hepatitis B combined vaccine IM (Completed)       Einar Pheasant, MD

## 2020-10-09 NOTE — Assessment & Plan Note (Addendum)
Physical today 10/09/20.  PAP 10/09/20.  Colonoscopy 01/2019.

## 2020-10-09 NOTE — Progress Notes (Signed)
Order placed for f/u potassium check.  

## 2020-10-11 LAB — BASIC METABOLIC PANEL
BUN/Creatinine Ratio: 22 (ref 9–23)
BUN: 17 mg/dL (ref 6–24)
CO2: 23 mmol/L (ref 20–29)
Calcium: 9.7 mg/dL (ref 8.7–10.2)
Chloride: 105 mmol/L (ref 96–106)
Creatinine, Ser: 0.77 mg/dL (ref 0.57–1.00)
GFR calc Af Amer: 103 mL/min/{1.73_m2} (ref >59)
GFR calc non Af Amer: 90 mL/min/{1.73_m2} (ref >59)
Glucose: 104 mg/dL — ABNORMAL HIGH (ref 65–99)
Potassium: 4.6 mmol/L (ref 3.5–5.2)
Sodium: 142 mmol/L (ref 134–144)

## 2020-10-11 LAB — CBC WITH DIFFERENTIAL/PLATELET
Basophils Absolute: 0.1 10*3/uL (ref 0.0–0.2)
Basos: 1 %
EOS (ABSOLUTE): 0.3 10*3/uL (ref 0.0–0.4)
Eos: 5 %
Hematocrit: 40.3 % (ref 34.0–46.6)
Hemoglobin: 13.1 g/dL (ref 11.1–15.9)
Immature Grans (Abs): 0 10*3/uL (ref 0.0–0.1)
Immature Granulocytes: 0 %
Lymphocytes Absolute: 1.9 10*3/uL (ref 0.7–3.1)
Lymphs: 30 %
MCH: 28.4 pg (ref 26.6–33.0)
MCHC: 32.5 g/dL (ref 31.5–35.7)
MCV: 87 fL (ref 79–97)
Monocytes Absolute: 0.4 10*3/uL (ref 0.1–0.9)
Monocytes: 6 %
Neutrophils Absolute: 3.8 10*3/uL (ref 1.4–7.0)
Neutrophils: 58 %
Platelets: 228 10*3/uL (ref 150–450)
RBC: 4.62 x10E6/uL (ref 3.77–5.28)
RDW: 13.4 % (ref 11.7–15.4)
WBC: 6.4 10*3/uL (ref 3.4–10.8)

## 2020-10-11 LAB — LIPID PANEL
Chol/HDL Ratio: 3.5 ratio (ref 0.0–4.4)
Cholesterol, Total: 176 mg/dL (ref 100–199)
HDL: 51 mg/dL (ref >39)
LDL Chol Calc (NIH): 106 mg/dL — ABNORMAL HIGH (ref 0–99)
Triglycerides: 104 mg/dL (ref 0–149)
VLDL Cholesterol Cal: 19 mg/dL (ref 5–40)

## 2020-10-11 LAB — HEPATIC FUNCTION PANEL
ALT: 35 IU/L — ABNORMAL HIGH (ref 0–32)
AST: 30 IU/L (ref 0–40)
Albumin: 4.8 g/dL (ref 3.8–4.9)
Alkaline Phosphatase: 98 IU/L (ref 44–121)
Bilirubin Total: 0.5 mg/dL (ref 0.0–1.2)
Bilirubin, Direct: 0.14 mg/dL (ref 0.00–0.40)
Total Protein: 7.2 g/dL (ref 6.0–8.5)

## 2020-10-11 LAB — HIV ANTIBODY (ROUTINE TESTING W REFLEX): HIV Screen 4th Generation wRfx: NONREACTIVE

## 2020-10-11 LAB — TSH: TSH: 2.67 u[IU]/mL (ref 0.450–4.500)

## 2020-10-11 NOTE — Assessment & Plan Note (Signed)
Saw GI.  S/p EGD and colonoscopy as outlined.  Doing well on prilosec.  Follow.

## 2020-10-11 NOTE — Assessment & Plan Note (Signed)
On  Citalopram.  Appears to be doing well.  Continue.  Follow.

## 2020-10-11 NOTE — Assessment & Plan Note (Signed)
Resolved. Previous cxr as outlined.  Recheck cxr today.

## 2020-10-11 NOTE — Assessment & Plan Note (Signed)
Saw GI.  Recommended TwinRx.  Check liver function today.  Has lost weight.

## 2020-10-13 ENCOUNTER — Encounter: Payer: Self-pay | Admitting: Internal Medicine

## 2020-10-13 DIAGNOSIS — K5000 Crohn's disease of small intestine without complications: Secondary | ICD-10-CM | POA: Insufficient documentation

## 2020-10-13 NOTE — Assessment & Plan Note (Signed)
Mammogram ordered.  She will schedule.

## 2020-10-13 NOTE — Assessment & Plan Note (Signed)
Documented history of crohn's.  Has seen GI.  Bowels stable.  Follow.

## 2020-10-15 LAB — IGP, APTIMA HPV: HPV Aptima: NEGATIVE

## 2020-10-17 ENCOUNTER — Other Ambulatory Visit: Payer: Self-pay | Admitting: Internal Medicine

## 2020-10-17 DIAGNOSIS — R9389 Abnormal findings on diagnostic imaging of other specified body structures: Secondary | ICD-10-CM

## 2020-10-17 NOTE — Progress Notes (Signed)
Order placed for CT chest.

## 2020-10-21 ENCOUNTER — Telehealth: Payer: Self-pay | Admitting: Internal Medicine

## 2020-10-21 NOTE — Telephone Encounter (Signed)
lft vm for pt to call ofc to sch CT.

## 2020-10-24 ENCOUNTER — Ambulatory Visit
Admission: RE | Admit: 2020-10-24 | Discharge: 2020-10-24 | Disposition: A | Discharge status: Home / Self Care | Payer: Managed Care, Other (non HMO) | Source: Ambulatory Visit | Attending: Internal Medicine | Admitting: Internal Medicine

## 2020-10-24 ENCOUNTER — Other Ambulatory Visit: Payer: Self-pay

## 2020-10-24 DIAGNOSIS — R9389 Abnormal findings on diagnostic imaging of other specified body structures: Secondary | ICD-10-CM | POA: Insufficient documentation

## 2020-11-04 ENCOUNTER — Telehealth: Payer: Self-pay | Admitting: Internal Medicine

## 2020-11-04 NOTE — Telephone Encounter (Signed)
-----   Message from Tyler Pita, MD sent at 11/01/2020 11:08 AM EST ----- Regarding: RE: question Roque Lias,  I do not see anything concerning on her CT but she does appear to have asthma at least by the air trapping and her tendency of mucous plugging.  Scarring on the lingula is likely residual from prior infection.  But nothing major.  At the very minimum she needs a PFT.  She is already on Advair and I would continue this.  We will be happy to see her if needed be.  Hope this helps,  Mickel Baas    ----- Message ----- From: Einar Pheasant, MD Sent: 10/31/2020   6:30 AM EST To: Tyler Pita, MD Subject: RE: question                                   Sorry to bother you again with this, but just wanted your opinion on the follow up CT scan.  If this is someone you need to see, I will arrange an appointment.  Thank you again for your help.   Kortez Murtagh ----- Message ----- From: Tyler Pita, MD Sent: 10/15/2020   8:28 AM EST To: Einar Pheasant, MD Subject: RE: question                                   Randell Patient, He had her films it appears that she may have had a mucous plug and then clear this.  The follow-up film actually looks way better.  A CT scan of the chest with contrast to start with.  If abnormal, will be happy to see her.  Mickel Baas   ----- Message ----- From: Einar Pheasant, MD Sent: 10/15/2020   4:58 AM EST To: Tyler Pita, MD Subject: question                                       I wanted to get your opinion about this pt.  She had a "cold" in 05/2020.  Children had covid.  She tested negative (3x - outside tests).  She had issues with a persistent cough (occasionally blood tinged sputum - when coughing hard, etc), so had a cxr.  CXR report - "new linear atelectasis/scarring at left lung base".  She was treated - inhaler, etc.  I recently saw her in follow up and her cough has resolved.  A follow up cxr was read as:  Interval development of minimal  linear densities in the lingular segment of the left lung concerning for subsegmental atelectasis. I wanted to get your opinion regarding further w/up regarding these cxr changes.  (question of need for f/u with you, CT scan, etc).  Thank you for your help.   Taylan Mayhan

## 2020-11-07 ENCOUNTER — Ambulatory Visit (INDEPENDENT_AMBULATORY_CARE_PROVIDER_SITE_OTHER): Payer: Managed Care, Other (non HMO)

## 2020-11-07 ENCOUNTER — Telehealth: Payer: Self-pay

## 2020-11-07 ENCOUNTER — Other Ambulatory Visit: Payer: Self-pay

## 2020-11-07 ENCOUNTER — Telehealth: Payer: Self-pay | Admitting: Internal Medicine

## 2020-11-07 DIAGNOSIS — Z23 Encounter for immunization: Secondary | ICD-10-CM | POA: Diagnosis not present

## 2020-11-07 NOTE — Telephone Encounter (Signed)
-----   Message from Carmen L Gonzalez, MD sent at 11/01/2020 11:08 AM EST ----- Regarding: RE: question Hey Charlene,  I do not see anything concerning on her CT but she does appear to have asthma at least by the air trapping and her tendency of mucous plugging.  Scarring on the lingula is likely residual from prior infection.  But nothing major.  At the very minimum she needs a PFT.  She is already on Advair and I would continue this.  We will be happy to see her if needed be.  Hope this helps,  Laura    ----- Message ----- From: Scott, Charlene, MD Sent: 10/31/2020   6:30 AM EST To: Carmen L Gonzalez, MD Subject: RE: question                                   Sorry to bother you again with this, but just wanted your opinion on the follow up CT scan.  If this is someone you need to see, I will arrange an appointment.  Thank you again for your help.   Charlene ----- Message ----- From: Gonzalez, Carmen L, MD Sent: 10/15/2020   8:28 AM EST To: Charlene Scott, MD Subject: RE: question                                   Charlene, He had her films it appears that she may have had a mucous plug and then clear this.  The follow-up film actually looks way better.  A CT scan of the chest with contrast to start with.  If abnormal, will be happy to see her.  Laura   ----- Message ----- From: Scott, Charlene, MD Sent: 10/15/2020   4:58 AM EST To: Carmen L Gonzalez, MD Subject: question                                       I wanted to get your opinion about this pt.  She had a "cold" in 05/2020.  Children had covid.  She tested negative (3x - outside tests).  She had issues with a persistent cough (occasionally blood tinged sputum - when coughing hard, etc), so had a cxr.  CXR report - "new linear atelectasis/scarring at left lung base".  She was treated - inhaler, etc.  I recently saw her in follow up and her cough has resolved.  A follow up cxr was read as:  Interval development of minimal  linear densities in the lingular segment of the left lung concerning for subsegmental atelectasis. I wanted to get your opinion regarding further w/up regarding these cxr changes.  (question of need for f/u with you, CT scan, etc).  Thank you for your help.   Charlene      

## 2020-11-07 NOTE — Progress Notes (Addendum)
Patient came in today for her 2nd Hep A/Hep B vaccine given in  the left deltoid.  .   She tolerated well.  Juron Vorhees,cma

## 2020-12-20 ENCOUNTER — Other Ambulatory Visit: Payer: Self-pay

## 2020-12-20 ENCOUNTER — Ambulatory Visit
Admission: RE | Admit: 2020-12-20 | Discharge: 2020-12-20 | Disposition: A | Discharge status: Home / Self Care | Payer: Managed Care, Other (non HMO) | Source: Ambulatory Visit | Attending: Internal Medicine | Admitting: Internal Medicine

## 2020-12-20 DIAGNOSIS — Z1231 Encounter for screening mammogram for malignant neoplasm of breast: Secondary | ICD-10-CM | POA: Diagnosis present

## 2021-01-09 ENCOUNTER — Other Ambulatory Visit: Payer: Self-pay

## 2021-01-09 ENCOUNTER — Ambulatory Visit
Admission: EM | Admit: 2021-01-09 | Discharge: 2021-01-09 | Disposition: A | Discharge status: Home / Self Care | Payer: Managed Care, Other (non HMO) | Attending: Emergency Medicine | Admitting: Emergency Medicine

## 2021-01-09 DIAGNOSIS — B349 Viral infection, unspecified: Secondary | ICD-10-CM | POA: Diagnosis present

## 2021-01-09 DIAGNOSIS — Z20822 Contact with and (suspected) exposure to covid-19: Secondary | ICD-10-CM | POA: Diagnosis not present

## 2021-01-09 LAB — RESP PANEL BY RT-PCR (FLU A&B, COVID) ARPGX2
Influenza A by PCR: NEGATIVE
Influenza B by PCR: NEGATIVE
SARS Coronavirus 2 by RT PCR: NEGATIVE

## 2021-01-09 MED ORDER — AEROCHAMBER PLUS MISC: 2 refills | Status: DC

## 2021-01-09 MED ORDER — ONDANSETRON 8 MG PO TBDP: ORAL_TABLET | ORAL | 0 refills | Status: DC

## 2021-01-09 MED ORDER — IBUPROFEN 600 MG PO TABS: 600.0000 mg | ORAL_TABLET | Freq: Four times a day (QID) | ORAL | 0 refills | Status: AC | PRN

## 2021-01-09 NOTE — ED Triage Notes (Signed)
Patient states that she has been having nasal congestion, headache, diarrhea and fatigue x Sunday.

## 2021-01-09 NOTE — Discharge Instructions (Addendum)
Your COVID and flu a and B were both negative.  This is some viral illness.  Start Flonase, saline nasal irrigation for the nasal congestion.  You can also try some Mucinex.  Zofran up to 3 times a day.  This will slow down the diarrhea and take care of the nausea.  Push electrolyte containing fluids such as Pedialyte.  Take 600 mg ibuprofen combined with 1000 mg Tylenol together 3-4 times a day as needed for pain.  2 puffs from your albuterol inhaler using your spacer every 4-6 hours as needed for chest tightness.

## 2021-01-09 NOTE — ED Provider Notes (Signed)
HPI  SUBJECTIVE:  Tasha Woods is a 50 y.o. female who presents with 3 days of watery, nonbloody diarrhea.  Reports over 6 episodes per day.  She reports sore throat for 2 days, chest tightness and congestion starting yesterday.  She reports body aches, nasal congestion, headaches, fatigue, ear pain, nausea, abdominal pain after eating.  No loss of sense of smell or taste, postnasal drip, cough, wheezing, chest pain, shortness of breath, vomiting.  She was exposed to her mother last week, who has COVID.  No flu exposure.  She got the booster in December 21.  She also got the flu vaccine.  She has tried over-the-counter cold and flu medications without improvement in symptoms.  Symptoms are worse with eating.  No antibiotics in the past month.  No antipyretic in the past 6 hours.  She states that she has had 2 negative home COVID test.  She has a past medical history of asthma and is compliant with her Advair.  Uses albuterol as needed.  QVZ:DGLOV, Randell Patient, MD   Past Medical History:  Diagnosis Date  . Allergy   . Anxiety   . Asthma   . GERD (gastroesophageal reflux disease)     Past Surgical History:  Procedure Laterality Date  . CHOLECYSTECTOMY  06/08/14  . COLONOSCOPY  04-30-14   Dr Vira Agar  . INNER EAR SURGERY     age 41  . UPPER GI ENDOSCOPY  04-30-14   Dr Vira Agar    Family History  Problem Relation Age of Onset  . Colon polyps Mother   . Hypertension Father   . Breast cancer Neg Hx   . Colon cancer Neg Hx     Social History   Tobacco Use  . Smoking status: Never Smoker  . Smokeless tobacco: Never Used  Vaping Use  . Vaping Use: Never used  Substance Use Topics  . Alcohol use: Yes    Alcohol/week: 0.0 standard drinks    Comment: occasionally  . Drug use: No    No current facility-administered medications for this encounter.  Current Outpatient Medications:  .  albuterol (VENTOLIN HFA) 108 (90 Base) MCG/ACT inhaler, Inhale into the lungs., Disp: , Rfl:  .   citalopram (CELEXA) 20 MG tablet, TAKE 1 TABLET BY MOUTH  DAILY, Disp: 90 tablet, Rfl: 3 .  fluticasone (FLONASE) 50 MCG/ACT nasal spray, USE 2 SPRAYS IN BOTH  NOSTRILS DAILY, Disp: 48 g, Rfl: 1 .  fluticasone-salmeterol (ADVAIR HFA) 230-21 MCG/ACT inhaler, USE 2 PUFFS TWO TIMES DAILY, Disp: 36 g, Rfl: 1 .  ibuprofen (ADVIL) 600 MG tablet, Take 1 tablet (600 mg total) by mouth every 6 (six) hours as needed., Disp: 30 tablet, Rfl: 0 .  montelukast (SINGULAIR) 10 MG tablet, TAKE 1 TABLET BY MOUTH AT  BEDTIME, Disp: 90 tablet, Rfl: 3 .  omeprazole (PRILOSEC) 40 MG capsule, TAKE 1 CAPSULE BY MOUTH  DAILY, Disp: 90 capsule, Rfl: 3 .  ondansetron (ZOFRAN ODT) 8 MG disintegrating tablet, 1/2- 1 tablet q 8 hr prn nausea, vomiting, Disp: 20 tablet, Rfl: 0 .  Spacer/Aero-Holding Chambers (AEROCHAMBER PLUS) inhaler, Use with inhaler, Disp: 1 each, Rfl: 2  Allergies  Allergen Reactions  . Ceftin [Cefuroxime Axetil] Rash  . Erythromycin Rash  . Penicillins Rash  . Sulfa Antibiotics Rash     ROS  As noted in HPI.   Physical Exam  BP 129/88 (BP Location: Right Arm)   Pulse 72   Temp 98.1 F (36.7 C) (Oral)   Resp 17  Ht 5' 3"  (1.6 m)   Wt 81.2 kg   SpO2 100%   BMI 31.71 kg/m   Constitutional: Well developed, well nourished, no acute distress Eyes:  EOMI, conjunctiva normal bilaterally HENT: Normocephalic, atraumatic,mucus membranes moist.  TMs normal bilaterally positive mild nasal congestion.  Tonsils surgically absent.  Normal oropharynx without postnasal drip. Neck: No cervical adenopathy Respiratory: Normal inspiratory effort, good air movement, lungs clear bilaterally cardiovascular: Normal rate regular rhythm no murmurs rubs or gallop GI: nondistended soft, nontender, no guarding, rebound.  Active bowel sounds. skin: No rash, skin intact Musculoskeletal: no deformities Neurologic: Alert & oriented x 3, no focal neuro deficits Psychiatric: Speech and behavior appropriate   ED  Course   Medications - No data to display  Orders Placed This Encounter  Procedures  . Resp Panel by RT-PCR (Flu A&B, Covid) Nasopharyngeal Swab    Standing Status:   Standing    Number of Occurrences:   1    Order Specific Question:   Is this test for diagnosis or screening    Answer:   Diagnosis of ill patient    Order Specific Question:   Symptomatic for COVID-19 as defined by CDC    Answer:   Yes    Order Specific Question:   Date of Symptom Onset    Answer:   01/05/2021    Order Specific Question:   Hospitalized for COVID-19    Answer:   No    Order Specific Question:   Admitted to ICU for COVID-19    Answer:   No    Order Specific Question:   Previously tested for COVID-19    Answer:   No    Order Specific Question:   Resident in a congregate (group) care setting    Answer:   No    Order Specific Question:   Employed in healthcare setting    Answer:   No    Order Specific Question:   Pregnant    Answer:   No    Order Specific Question:   Has patient completed COVID vaccination(s) (2 doses of Pfizer/Moderna 1 dose of [employer])    Answer:   Yes    Order Specific Question:   Has patient completed COVID Booster / 3rd dose    Answer:   Yes  . Airborne and Contact precautions    Standing Status:   Standing    Number of Occurrences:   1    Results for orders placed or performed during the hospital encounter of 01/09/21 (from the past 24 hour(s))  Resp Panel by RT-PCR (Flu A&B, Covid) Nasopharyngeal Swab     Status: None   Collection Time: 01/09/21  9:05 AM   Specimen: Nasopharyngeal Swab; Nasopharyngeal(NP) swabs in vial transport medium  Result Value Ref Range   SARS Coronavirus 2 by RT PCR NEGATIVE NEGATIVE   Influenza A by PCR NEGATIVE NEGATIVE   Influenza B by PCR NEGATIVE NEGATIVE   No results found.  ED Clinical Impression  1. Viral illness   2. COVID-19 ruled out by laboratory testing      ED Assessment/Plan  COVID, flu negative.  Patient with a  viral illness.  Abdomen benign, patient appears nontoxic.  Will send home with Zofran for the nausea and diarrhea, Tylenol/ibuprofen, push fluids.  Start Flonase, saline nasal irrigation.  We will also send home with a spacer for her albuterol inhaler.  She is to use her inhaler every 4-6 hours as needed for chest tightness,  congestion.  Discussed labs, MDM, treatment plan, and plan for follow-up with patient. Discussed sn/sx that should prompt return to the ED. patient agrees with plan.   Meds ordered this encounter  Medications  . Spacer/Aero-Holding Chambers (AEROCHAMBER PLUS) inhaler    Sig: Use with inhaler    Dispense:  1 each    Refill:  2    Please educate patient on use  . ibuprofen (ADVIL) 600 MG tablet    Sig: Take 1 tablet (600 mg total) by mouth every 6 (six) hours as needed.    Dispense:  30 tablet    Refill:  0  . ondansetron (ZOFRAN ODT) 8 MG disintegrating tablet    Sig: 1/2- 1 tablet q 8 hr prn nausea, vomiting    Dispense:  20 tablet    Refill:  0      *This clinic note was created using [employer]. Therefore, there may be occasional mistakes despite careful proofreading.  ?    Melynda Ripple, MD 01/10/21 [phone]

## 2021-01-21 NOTE — Telephone Encounter (Signed)
Error. ng 

## 2021-02-14 ENCOUNTER — Other Ambulatory Visit: Payer: Self-pay | Admitting: Internal Medicine

## 2021-03-15 ENCOUNTER — Other Ambulatory Visit: Payer: Self-pay | Admitting: Internal Medicine

## 2021-04-09 ENCOUNTER — Encounter: Payer: Self-pay | Admitting: Internal Medicine

## 2021-04-09 ENCOUNTER — Ambulatory Visit: Payer: Managed Care, Other (non HMO) | Admitting: Internal Medicine

## 2021-04-09 ENCOUNTER — Other Ambulatory Visit: Payer: Self-pay

## 2021-04-09 VITALS — BP 124/70 | HR 78 | Temp 97.9°F | Resp 16 | Ht 63.0 in | Wt 184.4 lb

## 2021-04-09 DIAGNOSIS — K2100 Gastro-esophageal reflux disease with esophagitis, without bleeding: Secondary | ICD-10-CM

## 2021-04-09 DIAGNOSIS — F439 Reaction to severe stress, unspecified: Secondary | ICD-10-CM

## 2021-04-09 DIAGNOSIS — K5000 Crohn's disease of small intestine without complications: Secondary | ICD-10-CM | POA: Diagnosis not present

## 2021-04-09 DIAGNOSIS — Z83719 Family history of colon polyps, unspecified: Secondary | ICD-10-CM

## 2021-04-09 DIAGNOSIS — Z23 Encounter for immunization: Secondary | ICD-10-CM | POA: Diagnosis not present

## 2021-04-09 DIAGNOSIS — K760 Fatty (change of) liver, not elsewhere classified: Secondary | ICD-10-CM

## 2021-04-09 DIAGNOSIS — E041 Nontoxic single thyroid nodule: Secondary | ICD-10-CM

## 2021-04-09 DIAGNOSIS — Z8371 Family history of colonic polyps: Secondary | ICD-10-CM

## 2021-04-09 DIAGNOSIS — R9389 Abnormal findings on diagnostic imaging of other specified body structures: Secondary | ICD-10-CM

## 2021-04-09 MED ORDER — CITALOPRAM HYDROBROMIDE 20 MG PO TABS
ORAL_TABLET | ORAL | 1 refills | Status: DC
Start: 2021-04-09 — End: 2021-10-16

## 2021-04-09 NOTE — Progress Notes (Signed)
Patient ID: Tasha Woods, female   DOB: 1971-03-07, 50 y.o.   MRN: [id number]   Subjective:    Patient ID: Tasha Woods, female    DOB: 03/25/1971, 50 y.o.   MRN: [id number]  HPI This visit occurred during the SARS-CoV-2 public health emergency.  Safety protocols were in place, including screening questions prior to the visit, additional usage of staff PPE, and extensive cleaning of exam room while observing appropriate contact time as indicated for disinfecting solutions.   Patient here for a scheduled follow up. Here to follow up regarding increased stress.  Working in a new position. Discussed.  Taking citalopram - 14m q day.  Feels doing ok on this medication.  Does not need any further intervention at this time.  Tries to stay active.  No chest pain.  Was seen 01/09/21 - - watery diarrhea.  Also - sore throat, chest tightness and congestion.  Covid test and flu - negative.  Prescribed - zofran. Instructed to use flonase and saline nasal spray.  Inhaler as directed.  Persistent changes on cxr.  CT scan chest - minimal scarring in the right middle lobe and lingula.  No evidence of ILD.  Had discussed f/u with pulmonary.  Previously saw Dr SAlva Garnet  Eating.  No abdominal pain.  Bowels moving.   Past Medical History:  Diagnosis Date   Allergy    Anxiety    Asthma    GERD (gastroesophageal reflux disease)    Past Surgical History:  Procedure Laterality Date   CHOLECYSTECTOMY  06/08/14   COLONOSCOPY  04-30-14   Dr EMal MistyEAR SURGERY     age 50  UPPER GI ENDOSCOPY  04-30-14   Dr EVira Agar  Family History  Problem Relation Age of Onset   Colon polyps Mother    Hypertension Father    Breast cancer Neg Hx    Colon cancer Neg Hx    Social History   Socioeconomic History   Marital status: Married    Spouse name: Not on file   Number of children: 2   Years of education: Not on file   Highest education level: Not on file  Occupational History    Employer: [employer]  Tobacco  Use   Smoking status: Never   Smokeless tobacco: Never  Vaping Use   Vaping Use: Never used  Substance and Sexual Activity   Alcohol use: Yes    Alcohol/week: 0.0 standard drinks    Comment: occasionally   Drug use: No   Sexual activity: Yes  Other Topics Concern   Not on file  Social History Narrative   Not on file   Social Determinants of Health   Financial Resource Strain: Not on file  Food Insecurity: Not on file  Transportation Needs: Not on file  Physical Activity: Not on file  Stress: Not on file  Social Connections: Not on file    Review of Systems  Constitutional:  Negative for appetite change and unexpected weight change.  HENT:  Negative for congestion and sinus pressure.   Respiratory:  Negative for chest tightness.        No increased sob.   Cardiovascular:  Negative for chest pain, palpitations and leg swelling.  Gastrointestinal:  Negative for abdominal pain, diarrhea, nausea and vomiting.  Genitourinary:  Negative for difficulty urinating and dysuria.  Musculoskeletal:  Negative for joint swelling and myalgias.  Skin:  Negative for color change and rash.  Neurological:  Negative for dizziness, light-headedness  and headaches.  Psychiatric/Behavioral:  Negative for agitation and dysphoric mood.       Objective:    Physical Exam Vitals reviewed.  Constitutional:      General: She is not in acute distress.    Appearance: Normal appearance.  HENT:     Head: Normocephalic and atraumatic.     Right Ear: External ear normal.     Left Ear: External ear normal.  Eyes:     General: No scleral icterus.       Right eye: No discharge.        Left eye: No discharge.     Conjunctiva/sclera: Conjunctivae normal.  Neck:     Thyroid: No thyromegaly.  Cardiovascular:     Rate and Rhythm: Normal rate and regular rhythm.  Pulmonary:     Effort: No respiratory distress.     Breath sounds: Normal breath sounds. No wheezing.  Abdominal:     General: Bowel sounds  are normal.     Palpations: Abdomen is soft.     Tenderness: There is no abdominal tenderness.  Musculoskeletal:        General: No swelling or tenderness.     Cervical back: Neck supple. No tenderness.  Lymphadenopathy:     Cervical: No cervical adenopathy.  Skin:    Findings: No erythema or rash.  Neurological:     Mental Status: She is alert.  Psychiatric:        Mood and Affect: Mood normal.        Behavior: Behavior normal.    BP 124/70   Pulse 78   Temp 97.9 F (36.6 C)   Resp 16   Ht 5' 3"  (1.6 m)   Wt 184 lb 6.4 oz (83.6 kg)   SpO2 98%   BMI 32.66 kg/m  Wt Readings from Last 3 Encounters:  04/09/21 184 lb 6.4 oz (83.6 kg)  01/09/21 179 lb (81.2 kg)  10/09/20 182 lb (82.6 kg)    Outpatient Encounter Medications as of 04/09/2021  Medication Sig   citalopram (CELEXA) 20 MG tablet Take 1 1/2 tablet q day   fluticasone (FLONASE) 50 MCG/ACT nasal spray USE 2 SPRAYS IN BOTH  NOSTRILS DAILY   fluticasone-salmeterol (ADVAIR HFA) 230-21 MCG/ACT inhaler USE 2 PUFFS TWO TIMES DAILY   ibuprofen (ADVIL) 600 MG tablet Take 1 tablet (600 mg total) by mouth every 6 (six) hours as needed.   montelukast (SINGULAIR) 10 MG tablet TAKE 1 TABLET BY MOUTH AT  BEDTIME   omeprazole (PRILOSEC) 40 MG capsule TAKE 1 CAPSULE BY MOUTH  DAILY   ondansetron (ZOFRAN ODT) 8 MG disintegrating tablet 1/2- 1 tablet q 8 hr prn nausea, vomiting   [DISCONTINUED] albuterol (VENTOLIN HFA) 108 (90 Base) MCG/ACT inhaler Inhale into the lungs.   [DISCONTINUED] citalopram (CELEXA) 20 MG tablet TAKE 1 TABLET BY MOUTH  DAILY   [DISCONTINUED] Spacer/Aero-Holding Chambers (AEROCHAMBER PLUS) inhaler Use with inhaler   No facility-administered encounter medications on file as of 04/09/2021.     Lab Results  Component Value Date   WBC 6.4 10/09/2020   HGB 13.1 10/09/2020   HCT 40.3 10/09/2020   PLT 228 10/09/2020   GLUCOSE 104 (H) 10/09/2020   CHOL 176 10/09/2020   TRIG 104 10/09/2020   HDL 51 10/09/2020    LDLCALC 106 (H) 10/09/2020   ALT 35 (H) 10/09/2020   AST 30 10/09/2020   NA 142 10/09/2020   K 4.6 10/09/2020   CL 105 10/09/2020   CREATININE 0.77 10/09/2020  BUN 17 10/09/2020   CO2 23 10/09/2020   TSH 2.670 10/09/2020   HGBA1C 5.3 03/31/2019       Assessment & Plan:   Problem List Items Addressed This Visit     Abnormal CT of the chest    History of covid.  CT chest - minimal scarring in the RML and ingula.  Discussed pulmonary referral.  Discussed PFTs, etc.  Agreeable to referral.        Relevant Orders   Ambulatory referral to Pulmonology   Crohn's disease of small intestine without complication ([place])    Has seen GI. Documented history of crohn's.  Bowels stable.       Family history of colonic polyps    Colonoscopy 01/31/19.        Fatty liver disease, nonalcoholic    Follow liver panel.  Continue diet and exercise.  TwinRx.        Reflux esophagitis    Doing well on prilosec.  Follow.  S/p EGD and colonoscopy.       Stress    Has changed positions.  Increased stress. On citalopram.  Does not feel needs any further intervention.  Follow.        Other Visit Diagnoses     Need for hepatitis A and B vaccination    -  Primary   Relevant Orders   Hepatitis A hepatitis B combined vaccine IM (Completed)        Einar Pheasant, MD

## 2021-04-10 NOTE — Telephone Encounter (Signed)
Order placed for endocrinology referral.

## 2021-04-14 ENCOUNTER — Encounter: Payer: Self-pay | Admitting: Internal Medicine

## 2021-04-14 DIAGNOSIS — R9389 Abnormal findings on diagnostic imaging of other specified body structures: Secondary | ICD-10-CM | POA: Insufficient documentation

## 2021-04-14 NOTE — Assessment & Plan Note (Signed)
Follow liver panel.  Continue diet and exercise.  TwinRx.

## 2021-04-14 NOTE — Assessment & Plan Note (Signed)
Doing well on prilosec.  Follow.  S/p EGD and colonoscopy.

## 2021-04-14 NOTE — Assessment & Plan Note (Signed)
Has seen GI. Documented history of crohn's.  Bowels stable.

## 2021-04-14 NOTE — Assessment & Plan Note (Signed)
Colonoscopy 01/31/19.

## 2021-04-14 NOTE — Assessment & Plan Note (Signed)
Has changed positions.  Increased stress. On citalopram.  Does not feel needs any further intervention.  Follow.

## 2021-04-14 NOTE — Assessment & Plan Note (Signed)
History of covid.  CT chest - minimal scarring in the RML and ingula.  Discussed pulmonary referral.  Discussed PFTs, etc.  Agreeable to referral.

## 2021-04-23 ENCOUNTER — Encounter: Payer: Self-pay | Admitting: Internal Medicine

## 2021-04-23 NOTE — Telephone Encounter (Signed)
Spoke with patient, her symptoms start yesterday. Having sinus sx, non productive cough, head ache and body aches. Denies sob, chest pain, fever, nausea, vomiting, etc. Patient is aware PCP is out of office this PM and tomorrow. Offered appt for Friday. Patient is interested in antiviral medication/infusion. After discussing with patient she is going to use the virtual urgent care since she does have asthma and does not want her symptoms to get worse while pcp is out of the office. Patient was ok with this and was advised to let me know if she needs anything.

## 2021-05-29 ENCOUNTER — Ambulatory Visit: Payer: Managed Care, Other (non HMO) | Admitting: Pulmonary Disease

## 2021-05-29 ENCOUNTER — Encounter: Payer: Self-pay | Admitting: Pulmonary Disease

## 2021-05-29 ENCOUNTER — Other Ambulatory Visit: Payer: Self-pay

## 2021-05-29 ENCOUNTER — Other Ambulatory Visit
Admission: RE | Admit: 2021-05-29 | Discharge: 2021-05-29 | Disposition: A | Discharge status: Home / Self Care | Payer: Managed Care, Other (non HMO) | Source: Ambulatory Visit | Attending: Pulmonary Disease | Admitting: Pulmonary Disease

## 2021-05-29 VITALS — BP 134/90 | HR 80 | Temp 98.1°F | Ht 63.0 in | Wt 188.4 lb

## 2021-05-29 DIAGNOSIS — K219 Gastro-esophageal reflux disease without esophagitis: Secondary | ICD-10-CM | POA: Diagnosis not present

## 2021-05-29 DIAGNOSIS — Z8616 Personal history of COVID-19: Secondary | ICD-10-CM

## 2021-05-29 DIAGNOSIS — J4540 Moderate persistent asthma, uncomplicated: Secondary | ICD-10-CM

## 2021-05-29 DIAGNOSIS — R058 Other specified cough: Secondary | ICD-10-CM

## 2021-05-29 DIAGNOSIS — R9389 Abnormal findings on diagnostic imaging of other specified body structures: Secondary | ICD-10-CM

## 2021-05-29 LAB — CBC WITH DIFFERENTIAL/PLATELET
Abs Immature Granulocytes: 0.03 10*3/uL (ref 0.00–0.07)
Basophils Absolute: 0.1 10*3/uL (ref 0.0–0.1)
Basophils Relative: 1 %
Eosinophils Absolute: 0.4 10*3/uL (ref 0.0–0.5)
Eosinophils Relative: 5 %
HCT: 38.2 % (ref 36.0–46.0)
Hemoglobin: 13 g/dL (ref 12.0–15.0)
Immature Granulocytes: 0 %
Lymphocytes Relative: 27 %
Lymphs Abs: 2.1 10*3/uL (ref 0.7–4.0)
MCH: 28.5 pg (ref 26.0–34.0)
MCHC: 34 g/dL (ref 30.0–36.0)
MCV: 83.8 fL (ref 80.0–100.0)
Monocytes Absolute: 0.5 10*3/uL (ref 0.1–1.0)
Monocytes Relative: 6 %
Neutro Abs: 4.8 10*3/uL (ref 1.7–7.7)
Neutrophils Relative %: 61 %
Platelets: 246 10*3/uL (ref 150–400)
RBC: 4.56 MIL/uL (ref 3.87–5.11)
RDW: 13.2 % (ref 11.5–15.5)
WBC: 7.9 10*3/uL (ref 4.0–10.5)
nRBC: 0 % (ref 0.0–0.2)

## 2021-05-29 MED ORDER — SPIRIVA RESPIMAT 2.5 MCG/ACT IN AERS: 2.0000 | INHALATION_SPRAY | Freq: Every day | RESPIRATORY_TRACT | 0 refills | Status: DC

## 2021-05-29 NOTE — Patient Instructions (Signed)
We are going to get a breathing test and blood test to reassess your asthma.  We are giving her a trial of Spiriva 1.25, 2 puffs once a day.  Continue using your Advair.  With your airway clearance recommend using a midrange harmonica blowing in and out that helps loosening up your secretions.  We will see you follow-up in 3 to 4 weeks time, call us sooner should any new problems arise we will see me or the nurse practitioner at that time.  We mainly want to see if your cough is better, worse or unchanged at that time.  Also we will go over test results.

## 2021-05-29 NOTE — Progress Notes (Signed)
Subjective:    Patient ID: Tasha Woods, female    DOB: 1971-04-05, 50 y.o.   MRN: [id number] Chief Complaint  Patient presents with   Consult    Pt states Dr. Nicki Reaper referred after cx xray look abnormal on left lower lung, pts states nagging cough after Covid Aug 2022   HPI The patient is a 51 year old lifelong never smoker who presents for reevaluation of coughing and shortness of breath.  Also there is a concern about "scarring" found on a high-resolution CT scan of the chest.  Patient is kindly referred by Dr. Einar Pheasant.  Patient had previously been evaluated by Dr. Merton Border in 2018.  She had not followed here since July 2018.  At that time the patient complained of cough that was a recurrent issue for her.  It was determined that she had moderate persistent asthma and she has been treated ever since with Advair HFA.  He also has issues gastroesophageal reflux and has been treated with Prilosec for the same.  She has been compliant with the Advair HFA and this had helped her.  However around October 2021 he developed issues with cough.  At that time a chest x-ray was obtained that showed some new linear atelectasis on the left base.  This was followed by another chest radiograph on October 09, 2020 which look markedly better with some very minimal streaky atelectasis noted.  This was then subsequently followed with a high resolution CT chest that showed no interstitial lung disease, minimal scarring in the lingula and right middle lobe and some air trapping suggesting of small airways disease.  This is consistent with asthma.  Suspect that the patient has various degrees of mucous plugging causing the atelectasis.  Patient also notices mucus production particularly thick and occasionally as plugs.  She notes that when she expectorates well her breathing difficulties are improved.  She does note some dyspnea on exertion over the baseline.  She does not use rescue inhaler much and tries to  rely only on her maintenance inhaler.  Patient did have COVID 19 on 20 August, she did not require hospitalization and actually did fairly well did notice some increase in her cough after COVID-19.  Patient has no military history.  Lives with her husband and children.  She works as a Freight forwarder at a [employer].  She has not had any exotic travel.  No hot tubs in the home.  DATA: 12/03/16 PFTs: FVC:  2.74 L ( 88%pred), FEV1:  2.26 L ( 87%pred), FEV1/FVC: 83%, TLC:  4.00L ( 83%pred), DLCO  80 %pred. Normal spirometry, low normal lung volumes and DLCO 10/24/2020 High res CT: No interstitial lung disease.  Air trapping consistent with small airways disease.  Minimal scarring noted particularly in the lingula.   Review of Systems A 10 point review of systems was performed and it is as noted above otherwise negative.  Past Medical History:  Diagnosis Date   Allergy    Anxiety    Asthma    GERD (gastroesophageal reflux disease)    Past Surgical History:  Procedure Laterality Date   CHOLECYSTECTOMY  06/08/14   COLONOSCOPY  04-30-14   Dr Mal Misty EAR SURGERY     age 65   UPPER GI ENDOSCOPY  04-30-14   Dr Vira Agar   Family History  Problem Relation Age of Onset   Colon polyps Mother    Hypertension Father    Asthma Father    Cancer Father  Breast cancer Neg Hx    Colon cancer Neg Hx     Social History   Tobacco Use   Smoking status: Never   Smokeless tobacco: Never  Substance Use Topics   Alcohol use: Yes    Alcohol/week: 0.0 standard drinks    Comment: occasionally   Allergies  Allergen Reactions   Ceftin [Cefuroxime Axetil] Rash   Erythromycin Rash   Penicillins Rash   Sulfa Antibiotics Rash   Current Meds  Medication Sig   Ascorbic Acid (VITAMIN C) 1000 MG tablet Take 1,000 mg by mouth daily.   citalopram (CELEXA) 20 MG tablet Take 1 1/2 tablet q day   fluticasone (FLONASE) 50 MCG/ACT nasal spray USE 2 SPRAYS IN BOTH  NOSTRILS DAILY    fluticasone-salmeterol (ADVAIR HFA) 230-21 MCG/ACT inhaler USE 2 PUFFS TWO TIMES DAILY   ibuprofen (ADVIL) 600 MG tablet Take 1 tablet (600 mg total) by mouth every 6 (six) hours as needed.   montelukast (SINGULAIR) 10 MG tablet TAKE 1 TABLET BY MOUTH AT  BEDTIME   Multiple Vitamin (MULTIVITAMIN) tablet Take 1 tablet by mouth daily.   omeprazole (PRILOSEC) 40 MG capsule TAKE 1 CAPSULE BY MOUTH  DAILY   ondansetron (ZOFRAN ODT) 8 MG disintegrating tablet 1/2- 1 tablet q 8 hr prn nausea, vomiting   Immunization History  Administered Date(s) Administered   Hep A / Hep B 10/09/2020, 11/07/2020, 04/09/2021   Influenza Split 07/15/2014   Influenza,inj,Quad PF,6+ Mos 07/07/2018   Influenza,inj,quad, With Preservative 07/14/2019   Influenza-Unspecified 07/01/2020   PFIZER(Purple Top)SARS-COV-2 Vaccination 10/29/2019, 11/21/2019        Objective:   Physical Exam BP 134/90 (BP Location: Left Arm, Patient Position: Sitting, Cuff Size: Normal)   Pulse 80   Temp 98.1 F (36.7 C) (Oral)   Ht 5' 3"  (1.6 m)   Wt 188 lb 6.4 oz (85.5 kg)   SpO2 98%   BMI 33.37 kg/m  GENERAL: Overweight woman, well-groomed, no acute distress, fully ambulatory.  No conversational dyspnea, no distress HEAD: Normocephalic, atraumatic.  EYES: Pupils equal, round, reactive to light.  No scleral icterus.  MOUTH: Nose/mouth/throat not examined due to masking requirements for COVID 19. NECK: Supple. No thyromegaly. Trachea midline. No JVD.  No adenopathy. PULMONARY: Good air entry bilaterally.  No adventitious sounds. CARDIOVASCULAR: S1 and S2. Regular rate and rhythm.  No rubs, murmurs or gallops heard.  ABDOMEN: Benign. MUSCULOSKELETAL: No joint deformity, no clubbing, no edema.  NEUROLOGIC: No focal deficit, no gait disturbance, speech is fluent. SKIN: Intact,warm,dry. PSYCH:  Representative high-resolution CT image from 24 October 2020 independently reviewed:     Assessment & Plan:     ICD-10-CM   1.  Moderate persistent asthma without complication  F81.01 Pulmonary Function Test [facility] Only    CBC w/Diff    Allergen Panel (27) + IGE   Needs reevaluation of this issue, appears poorly compensated Will obtain PFTs and allergen panel, CBC with differential Add Spiriva 1.25 mcg, 2 puffs daily     2. Gastroesophageal reflux disease, unspecified whether esophagitis present  K21.9    Continue Prilosec Continue antireflux measures    3. Recurrent cough  R05.8    Suspect related to asthma Reassessing as above May be related to postinfectious cough Adding Spiriva as above    4. Abnormal CT of the chest  R93.89    Mild scarring/atelectasis Likely related to mucous plugs No evidence of interstitial lung disease Mild a benign finding    5. Personal history of COVID-19  Z86.16    This issue adds complexity to her management Has noted some increasing cough post episode     Orders Placed This Encounter  Procedures   CBC w/Diff    Standing Status:   Future    Standing Expiration Date:   05/29/2022   Allergen Panel (27) + IGE    Standing Status:   Future    Standing Expiration Date:   05/29/2022   Pulmonary Function Test [facility] Only    Standing Status:   Future    Standing Expiration Date:   05/29/2022    Order Specific Question:   Full PFT: includes the following: basic spirometry, spirometry pre & post bronchodilator, diffusion capacity (DLCO), lung volumes    Answer:   Full PFT    Order Specific Question:   This test can only be performed at    Answer:   [facility] ordered this encounter  Medications   Tiotropium Bromide Monohydrate (SPIRIVA RESPIMAT) 2.5 MCG/ACT AERS    Sig: Inhale 2 puffs into the lungs daily.    Dispense:  4 g    Refill:  0    Order Specific Question:   Lot Number?    Answer:   [id number]    Order Specific Question:   Expiration Date?    Answer:   03/31/2022    Order Specific Question:   Quantity    Answer:   2   Patient will continue Advair for  now.  We are adding Spiriva as noted above.  Reassessing with PFTs, CBC with differential (checking for eosinophilia) and allergen panel.  She would benefit from an airway clearance device.  These can be somewhat pricey I have recommended that she get a midrange cheap harmonica at a local music store and use it for pulmonary hygiene this will help loosening up her secretions.  We will see her in follow-up in 3 to 4 weeks time she will either see me or a nurse practitioner at that time we will review laboratory data and reassess her cough at that time.  Renold Don, MD Advanced [facility]choscopy [facility]    *This note was dictated using voice recognition software/Dragon.  Despite best efforts to proofread, errors can occur which can change the meaning.  Any change was purely unintentional.

## 2021-05-30 ENCOUNTER — Telehealth: Payer: Self-pay | Admitting: Pulmonary Disease

## 2021-05-30 NOTE — Telephone Encounter (Signed)
CBC was normal, awaiting allergen panel.  I have called the pt and she is aware of results.  Once the results of the allergen panel come back she is aware we will call with these results.

## 2021-06-04 LAB — ALLERGEN PANEL (27) + IGE
Alternaria Alternata IgE: <0.1 kU/L
Aspergillus Fumigatus IgE: <0.1 kU/L
Bahia Grass IgE: <0.1 kU/L
Bermuda Grass IgE: <0.1 kU/L
Cat Dander IgE: <0.1 kU/L
Cedar, Mountain IgE: <0.1 kU/L
Cladosporium Herbarum IgE: <0.1 kU/L
Cocklebur IgE: <0.1 kU/L
Cockroach, American IgE: <0.1 kU/L
Common Silver Birch IgE: <0.1 kU/L
D Farinae IgE: <0.1 kU/L
D Pteronyssinus IgE: <0.1 kU/L
Dog Dander IgE: <0.1 kU/L
Elm, American IgE: <0.1 kU/L
Hickory, White IgE: <0.1 kU/L
IgE (Immunoglobulin E), Serum: 31 IU/mL (ref 6–495)
Johnson Grass IgE: <0.1 kU/L
Kentucky Bluegrass IgE: <0.1 kU/L
Maple/Box Elder IgE: <0.1 kU/L
Mucor Racemosus IgE: <0.1 kU/L
Oak, White IgE: <0.1 kU/L
Penicillium Chrysogen IgE: <0.1 kU/L
Pigweed, Rough IgE: <0.1 kU/L
Plantain, English IgE: <0.1 kU/L
Ragweed, Short IgE: <0.1 kU/L
Setomelanomma Rostrat: <0.1 kU/L
Timothy Grass IgE: <0.1 kU/L
White Mulberry IgE: <0.1 kU/L

## 2021-06-06 ENCOUNTER — Telehealth: Payer: Self-pay

## 2021-06-06 NOTE — Telephone Encounter (Signed)
ACT patient in regards to upcoming Covid test, will route to triage to ensure follow up.

## 2021-06-09 NOTE — Telephone Encounter (Signed)
Patient is aware of below date/time.  Nothing further needed at this time.

## 2021-06-11 ENCOUNTER — Other Ambulatory Visit
Admission: RE | Admit: 2021-06-11 | Discharge: 2021-06-11 | Disposition: A | Discharge status: Home / Self Care | Payer: Managed Care, Other (non HMO) | Source: Ambulatory Visit | Attending: Pulmonary Disease | Admitting: Pulmonary Disease

## 2021-06-11 ENCOUNTER — Other Ambulatory Visit: Payer: Self-pay

## 2021-06-11 DIAGNOSIS — Z01812 Encounter for preprocedural laboratory examination: Secondary | ICD-10-CM | POA: Diagnosis not present

## 2021-06-11 DIAGNOSIS — Z20822 Contact with and (suspected) exposure to covid-19: Secondary | ICD-10-CM | POA: Insufficient documentation

## 2021-06-11 LAB — SARS CORONAVIRUS 2 (TAT 6-24 HRS): SARS Coronavirus 2: NEGATIVE

## 2021-06-12 ENCOUNTER — Ambulatory Visit: Payer: Managed Care, Other (non HMO) | Attending: Pulmonary Disease

## 2021-06-12 DIAGNOSIS — R0600 Dyspnea, unspecified: Secondary | ICD-10-CM | POA: Diagnosis present

## 2021-06-12 DIAGNOSIS — R0602 Shortness of breath: Secondary | ICD-10-CM | POA: Insufficient documentation

## 2021-06-12 DIAGNOSIS — J4540 Moderate persistent asthma, uncomplicated: Secondary | ICD-10-CM | POA: Diagnosis not present

## 2021-06-12 MED ORDER — ALBUTEROL SULFATE (2.5 MG/3ML) 0.083% IN NEBU
2.5000 mg | INHALATION_SOLUTION | Freq: Once | RESPIRATORY_TRACT | Status: AC
  Administered 2021-06-12: 2.5 mg via RESPIRATORY_TRACT
  Filled 2021-06-12: qty 3

## 2021-06-20 ENCOUNTER — Encounter: Payer: Self-pay | Admitting: Adult Health

## 2021-06-20 ENCOUNTER — Telehealth: Payer: Self-pay

## 2021-06-20 ENCOUNTER — Other Ambulatory Visit: Payer: Self-pay

## 2021-06-20 ENCOUNTER — Ambulatory Visit: Payer: Managed Care, Other (non HMO) | Admitting: Adult Health

## 2021-06-20 VITALS — BP 140/92 | HR 88 | Temp 98.0°F | Ht 63.0 in | Wt 187.8 lb

## 2021-06-20 DIAGNOSIS — J4530 Mild persistent asthma, uncomplicated: Secondary | ICD-10-CM

## 2021-06-20 DIAGNOSIS — J45909 Unspecified asthma, uncomplicated: Secondary | ICD-10-CM | POA: Insufficient documentation

## 2021-06-20 DIAGNOSIS — R053 Chronic cough: Secondary | ICD-10-CM | POA: Diagnosis not present

## 2021-06-20 MED ORDER — PREDNISONE 20 MG PO TABS: 20.0000 mg | ORAL_TABLET | Freq: Every day | ORAL | 0 refills | Status: DC

## 2021-06-20 MED ORDER — SPIRIVA RESPIMAT 2.5 MCG/ACT IN AERS: 2.0000 | INHALATION_SPRAY | Freq: Every day | RESPIRATORY_TRACT | 5 refills | Status: AC

## 2021-06-20 MED ORDER — BENZONATATE 200 MG PO CAPS: 200.0000 mg | ORAL_CAPSULE | Freq: Three times a day (TID) | ORAL | 2 refills | Status: AC | PRN

## 2021-06-20 NOTE — Assessment & Plan Note (Signed)
Upper airway cough syndrome after COVID-19.  Patient pulmonary function testing showed some mild to moderate restriction consistent with underlying asthma .  We will check chest x-ray today.  Continue on cough suppression regimen.  Trigger prevention.  Plan  Patient Instructions  Prednisone 1m daily for 5 days-take with food  Begin Delsym 2 tsp Twice daily  for cough As needed  Begin Allegra 1822mdaily in am  Begin Chlorpheniramine 15m39mt bedtime  (Chlortabs)  Tessalon Three times a day  for cough As needed  Continue on Advair and Spiriva .  Voice rest, sips of water to soothe throat . Avoid throat clearing .  NO MINTS .  Follow up with Dr. GonPatsey Berthold 6 weeks and As needed   Please contact office for sooner follow up if symptoms do not improve or worsen or seek emergency care

## 2021-06-20 NOTE — Assessment & Plan Note (Addendum)
Asthmatic exacerbation with recent COVID-19 infection with ongoing upper airway cough Will treat with short course of prednisone.  Cough suppression regimen.  Along with trigger prevention Check Chest xray   Plan  Patient Instructions  Prednisone 19m daily for 5 days-take with food  Begin Delsym 2 tsp Twice daily  for cough As needed  Begin Allegra 18448mdaily in am  Begin Chlorpheniramine 48m86mt bedtime  (Chlortabs)  Tessalon Three times a day  for cough As needed  Continue on Advair and Spiriva .  Voice rest, sips of water to soothe throat . Avoid throat clearing .  NO MINTS .  Follow up with Dr. GonPatsey Berthold 6 weeks and As needed   Please contact office for sooner follow up if symptoms do not improve or worsen or seek emergency care

## 2021-06-20 NOTE — Telephone Encounter (Signed)
Called pt to remind her to get her Cx Xray done at her convenience. Pt verbalize understanding, nothing further needed.

## 2021-06-20 NOTE — Patient Instructions (Signed)
Prednisone 35m daily for 5 days-take with food  Begin Delsym 2 tsp Twice daily  for cough As needed  Begin Allegra 1886mdaily in am  Begin Chlorpheniramine 86m70mt bedtime  (Chlortabs)  Tessalon Three times a day  for cough As needed  Continue on Advair and Spiriva .  Voice rest, sips of water to soothe throat . Avoid throat clearing .  NO MINTS .  Follow up with Dr. GonPatsey Berthold 6 weeks and As needed   Please contact office for sooner follow up if symptoms do not improve or worsen or seek emergency care

## 2021-06-20 NOTE — Progress Notes (Signed)
@Patient  ID: Tasha Woods, female    DOB: February 24, 1971, 50 y.o.   MRN: [id number]  Chief Complaint  Patient presents with   Follow-up    Referring provider: Einar Pheasant, MD  HPI: 50 year old female never smoker seen May 29, 2021 to reestablish for asthma and post COVID cough  TEST/EVENTS :   06/20/2021 Follow up : Asthma and Post Covid Cough  Patient returns for a 3-week follow-up.  Patient was seen last visit to reestablish for asthma and an ongoing cough that she has had since COVID-19 in August 2022.  Patient describes a mild COVID infection.  She had been fully vaccinated.  She had some upper respiratory cold-like symptoms.  She treated at home.  Did not take an antiviral pack.  Patient says she has had lingering cough and wheezing since having COVID.  Last visit patient was continued on Advair.  Spiriva was added to her regimen.  Patient says she is some better but continues to have ongoing cough.  And intermittent wheezing.  She has not been using her albuterol.  Patient was set up for pulmonary function testing that was done last week that showed mild to moderate restriction with reversibility.  FEV1 79%, ratio 89, FVC 70%, no significant bronchodilator response.,  Mid flow reversibility.  DLCO was normal at 103%. Patient denies any fever, discolored mucus or orthopnea or edema.  Says she has severe coughing paroxysms at times.  Cough does keep her up at times.   Allergies  Allergen Reactions   Ceftin [Cefuroxime Axetil] Rash   Erythromycin Rash   Penicillins Rash   Sulfa Antibiotics Rash    Immunization History  Administered Date(s) Administered   Hep A / Hep B 10/09/2020, 11/07/2020, 04/09/2021   Influenza Split 07/15/2014   Influenza,inj,Quad PF,6+ Mos 07/07/2018   Influenza,inj,quad, With Preservative 07/14/2019   Influenza-Unspecified 07/01/2020   PFIZER(Purple Top)SARS-COV-2 Vaccination 10/29/2019, 11/21/2019    Past Medical History:  Diagnosis Date    Allergy    Anxiety    Asthma    GERD (gastroesophageal reflux disease)     Tobacco History: Social History   Tobacco Use  Smoking Status Never  Smokeless Tobacco Never   Counseling given: Not Answered   Outpatient Medications Prior to Visit  Medication Sig Dispense Refill   Ascorbic Acid (VITAMIN C) 1000 MG tablet Take 1,000 mg by mouth daily.     citalopram (CELEXA) 20 MG tablet Take 1 1/2 tablet q day 135 tablet 1   fluticasone (FLONASE) 50 MCG/ACT nasal spray USE 2 SPRAYS IN BOTH  NOSTRILS DAILY 48 g 1   fluticasone-salmeterol (ADVAIR HFA) 230-21 MCG/ACT inhaler USE 2 PUFFS TWO TIMES DAILY 36 g 1   ibuprofen (ADVIL) 600 MG tablet Take 1 tablet (600 mg total) by mouth every 6 (six) hours as needed. 30 tablet 0   montelukast (SINGULAIR) 10 MG tablet TAKE 1 TABLET BY MOUTH AT  BEDTIME 90 tablet 3   Multiple Vitamin (MULTIVITAMIN) tablet Take 1 tablet by mouth daily.     omeprazole (PRILOSEC) 40 MG capsule TAKE 1 CAPSULE BY MOUTH  DAILY 90 capsule 3   ondansetron (ZOFRAN ODT) 8 MG disintegrating tablet 1/2- 1 tablet q 8 hr prn nausea, vomiting 20 tablet 0   Tiotropium Bromide Monohydrate (SPIRIVA RESPIMAT) 2.5 MCG/ACT AERS Inhale 2 puffs into the lungs daily. 4 g 0   No facility-administered medications prior to visit.     Review of Systems:   Constitutional:   No  weight loss, night  sweats,  Fevers, chills,  +fatigue, or  lassitude.  HEENT:   No headaches,  Difficulty swallowing,  Tooth/dental problems, or  Sore throat,                No sneezing, itching, ear ache,  +nasal congestion, post nasal drip,   CV:  No chest pain,  Orthopnea, PND, swelling in lower extremities, anasarca, dizziness, palpitations, syncope.   GI  No heartburn, indigestion, abdominal pain, nausea, vomiting, diarrhea, change in bowel habits, loss of appetite, bloody stools.   Resp: .  No chest wall deformity  Skin: no rash or lesions.  GU: no dysuria, change in color of urine, no urgency or  frequency.  No flank pain, no hematuria   MS:  No joint pain or swelling.  No decreased range of motion.  No back pain.    Physical Exam  BP (!) 140/92 (BP Location: Left Arm, Patient Position: Sitting, Cuff Size: Normal)   Pulse 88   Temp 98 F (36.7 C) (Oral)   Ht 5' 3"  (1.6 m)   Wt 187 lb 12.8 oz (85.2 kg)   SpO2 97%   BMI 33.27 kg/m   GEN: A/Ox3; pleasant , NAD, well nourished    HEENT:  [place]/AT,  NOSE-clear, THROAT-clear, no lesions, no postnasal drip or exudate noted.   NECK:  Supple w/ fair ROM; no JVD; normal carotid impulses w/o bruits; no thyromegaly or nodules palpated; no lymphadenopathy.    RESP  faint exp wheeze on (no accessory muscle use, no dullness to percussion  CARD:  RRR, no m/r/g, no peripheral edema, pulses intact, no cyanosis or clubbing.  GI:   Soft & nt; nml bowel sounds; no organomegaly or masses detected.   Musco: Warm bil, no deformities or joint swelling noted.   Neuro: alert, no focal deficits noted.    Skin: Warm, no lesions or rashes    Lab Results:  CBC    Component Value Date/Time   WBC 7.9 05/29/2021 1248   RBC 4.56 05/29/2021 1248   HGB 13.0 05/29/2021 1248   HGB 13.1 10/09/2020 0822   HCT 38.2 05/29/2021 1248   HCT 40.3 10/09/2020 0822   PLT 246 05/29/2021 1248   PLT 228 10/09/2020 0822   MCV 83.8 05/29/2021 1248   MCV 87 10/09/2020 0822   MCV 85 04/23/2014 1218   MCH 28.5 05/29/2021 1248   MCHC 34.0 05/29/2021 1248   RDW 13.2 05/29/2021 1248   RDW 13.4 10/09/2020 0822   RDW 13.3 04/23/2014 1218   LYMPHSABS 2.1 05/29/2021 1248   LYMPHSABS 1.9 10/09/2020 0822   LYMPHSABS 2.2 04/23/2014 1218   MONOABS 0.5 05/29/2021 1248   MONOABS 0.4 04/23/2014 1218   EOSABS 0.4 05/29/2021 1248   EOSABS 0.3 10/09/2020 0822   EOSABS 0.2 04/23/2014 1218   BASOSABS 0.1 05/29/2021 1248   BASOSABS 0.1 10/09/2020 0822   BASOSABS 0.1 04/23/2014 1218    BMET    Component Value Date/Time   NA 142 10/09/2020 0822   NA 141 04/23/2014  1218   K 4.6 10/09/2020 0822   K 4.4 04/23/2014 1218   CL 105 10/09/2020 0822   CL 108 (H) 04/23/2014 1218   CO2 23 10/09/2020 0822   CO2 24 04/23/2014 1218   GLUCOSE 104 (H) 10/09/2020 0822   GLUCOSE 88 04/23/2014 1218   BUN 17 10/09/2020 0822   BUN 11 04/23/2014 1218   CREATININE 0.77 10/09/2020 0822   CREATININE 0.80 04/23/2014 1218   CALCIUM 9.7 10/09/2020  5638   CALCIUM 9.1 04/23/2014 1218   GFRNONAA 90 10/09/2020 0822   GFRNONAA >60 04/23/2014 1218   GFRAA 103 10/09/2020 0822   GFRAA >60 04/23/2014 1218    BNP    Component Value Date/Time   BNP 8.5 09/18/2016 1408    ProBNP No results found for: PROBNP  Imaging: Pulmonary Function Test [facility] Only  Result Date: 06/12/2021 Spirometry Data Is Acceptable and Reproducible Moderate Restrictive Lung disease probable underlying reactive airways disease Suggestive of Hyperinflation with some BD response Consider outpatient Pulmonary Consultation if needed Clinical Correlation Advised    albuterol (PROVENTIL) (2.5 MG/3ML) 0.083% nebulizer solution 2.5 mg     Date Action Dose Route User   06/12/2021 0911 Given 2.5 mg Nebulization Lowrance, Amy, RRT       No flowsheet data found.  No results found for: NITRICOXIDE      Assessment & Plan:   Asthma Asthmatic exacerbation with recent COVID-19 infection with ongoing upper airway cough Will treat with short course of prednisone.  Cough suppression regimen.  Along with trigger prevention Check Chest xray   Plan  Patient Instructions  Prednisone 107m daily for 5 days-take with food  Begin Delsym 2 tsp Twice daily  for cough As needed  Begin Allegra 1872mdaily in am  Begin Chlorpheniramine 56m50mt bedtime  (Chlortabs)  Tessalon Three times a day  for cough As needed  Continue on Advair and Spiriva .  Voice rest, sips of water to soothe throat . Avoid throat clearing .  NO MINTS .  Follow up with Dr. GonPatsey Berthold 6 weeks and As needed   Please contact office for  sooner follow up if symptoms do not improve or worsen or seek emergency care        Cough Upper airway cough syndrome after COVID-19.  Patient pulmonary function testing showed some mild to moderate restriction consistent with underlying asthma .  We will check chest x-ray today.  Continue on cough suppression regimen.  Trigger prevention.  Plan  Patient Instructions  Prednisone 20m42mily for 5 days-take with food  Begin Delsym 2 tsp Twice daily  for cough As needed  Begin Allegra 180mg59mly in am  Begin Chlorpheniramine 56mg A58medtime  (Chlortabs)  Tessalon Three times a day  for cough As needed  Continue on Advair and Spiriva .  Voice rest, sips of water to soothe throat . Avoid throat clearing .  NO MINTS .  Follow up with Dr. GonzalPatsey Bertholdweeks and As needed   Please contact office for sooner follow up if symptoms do not improve or worsen or seek emergency care          Imberly Troxler Rexene Edison0/21/2022

## 2021-07-21 ENCOUNTER — Encounter: Payer: Self-pay | Admitting: Internal Medicine

## 2021-08-06 ENCOUNTER — Ambulatory Visit: Payer: Managed Care, Other (non HMO) | Admitting: Pulmonary Disease

## 2021-08-06 ENCOUNTER — Encounter: Payer: Self-pay | Admitting: Pulmonary Disease

## 2021-08-06 ENCOUNTER — Other Ambulatory Visit: Payer: Self-pay

## 2021-08-06 VITALS — BP 124/80 | HR 83 | Temp 97.5°F | Ht 63.0 in | Wt 187.4 lb

## 2021-08-06 DIAGNOSIS — J4540 Moderate persistent asthma, uncomplicated: Secondary | ICD-10-CM | POA: Diagnosis not present

## 2021-08-06 DIAGNOSIS — Z8616 Personal history of COVID-19: Secondary | ICD-10-CM

## 2021-08-06 DIAGNOSIS — R058 Other specified cough: Secondary | ICD-10-CM

## 2021-08-06 MED ORDER — FLUTICASONE FUROATE-VILANTEROL 100-25 MCG/ACT IN AEPB: 1.0000 | INHALATION_SPRAY | Freq: Every day | RESPIRATORY_TRACT | 11 refills | Status: AC

## 2021-08-06 MED ORDER — ALBUTEROL SULFATE HFA 108 (90 BASE) MCG/ACT IN AERS: 2.0000 | INHALATION_SPRAY | Freq: Four times a day (QID) | RESPIRATORY_TRACT | 2 refills | Status: AC | PRN

## 2021-08-06 NOTE — Progress Notes (Signed)
Subjective:    Patient ID: Tasha Woods, female    DOB: Sep 27, 1970, 50 y.o.   MRN: [id number] Chief Complaint  Patient presents with   Follow-up    No current sx.     HPI Is a 50 year old lifelong never smoker who presents for follow-up on the issue of asthma and post-COVID cough.  She was last seen by Tasha Edison, NP on 20 June 2021 after having reestablished with [place] in September.  At that time she also was noted to have some abnormalities on CT scan of the chest that were related to mucous plugging and atelectasis as well as air trapping is consistent with asthma.  Since her previous visit the patient has been doing well and her cough has resolved.  She is not using Advair consistently because she forgets the second dose of the day.  She does not have an albuterol rescue.  She has been on Spiriva which she has stopped using altogether.  Cough resolved.  She has not had any fevers, chills or sweats.  No chest pain, lower extremity edema or calf tenderness.  Overall she feels well and looks well.  DATA: 12/03/16 PFTs: FVC:  2.74 L ( 88%pred), FEV1:  2.26 L ( 87%pred), FEV1/FVC: 83%, TLC:  4.00L ( 83%pred), DLCO  80 %pred. Normal spirometry, low normal lung volumes and DLCO 10/24/2020 High res CT: No interstitial lung disease.  Air trapping consistent with small airways disease. Minimal scarring noted particularly in the lingula. 06/12/2021 PFTs: FEV1 1.97 L or 73% predicted, FVC 2.28 L or 67% predicted, FEV1/FVC 87%.  Lung volumes lower limit of normal with ERV 7% indicating restriction due to obesity.  There was significant reduction in air trapping after bronchodilator.  Significant reduction in airway resistance after bronchodilator significant small airways component with airways reversibility.  Normal at DLCO.  Consistent with patient's diagnosis of asthma  Review of Systems A 10 point review of systems was performed and it is as noted above otherwise negative.  Patient Active  Problem List   Diagnosis Date Noted   Asthma 06/20/2021   Abnormal CT of the chest 04/14/2021   Crohn's disease of small intestine without complication (Tasha Woods) 84/13/2440   Atelectasis 10/09/2020   Encounter for completion of form with patient 06/23/2020   Peroneal tendinitis 07/26/2019   Chronic diarrhea 04/20/2019   Duodenal ulcer 04/20/2019   Elevated liver enzymes 04/20/2019   Breast cancer screening 04/16/2019   RLQ abdominal pain 01/01/2019   Mild concussion 09/12/2017   Cough 11/01/2016   Fatty liver disease, nonalcoholic 06/27/2535   Health care maintenance 12/09/2014   BMI 32.0-32.9,adult 08/27/2014   Stress 08/27/2014   Chronic cholecystitis 06/19/2014   Family history of colonic polyps 05/10/2014   Reflux esophagitis 05/10/2014   Environmental allergies 11/27/2012   Menopausal syndrome 08/07/2012   Social History   Tobacco Use   Smoking status: Never   Smokeless tobacco: Never  Substance Use Topics   Alcohol use: Yes    Alcohol/week: 0.0 standard drinks    Comment: occasionally   Allergies  Allergen Reactions   Ceftin [Cefuroxime Axetil] Rash   Erythromycin Rash   Penicillins Rash   Sulfa Antibiotics Rash   Current Meds  Medication Sig   albuterol (VENTOLIN HFA) 108 (90 Base) MCG/ACT inhaler Inhale 2 puffs into the lungs every 6 (six) hours as needed for wheezing or shortness of breath.   Ascorbic Acid (VITAMIN C) 1000 MG tablet Take 1,000 mg by mouth daily.   benzonatate (  TESSALON) 200 MG capsule Take 1 capsule (200 mg total) by mouth 3 (three) times daily as needed for cough.   citalopram (CELEXA) 20 MG tablet Take 1 1/2 tablet q day   fluticasone (FLONASE) 50 MCG/ACT nasal spray USE 2 SPRAYS IN BOTH  NOSTRILS DAILY   fluticasone furoate-vilanterol (BREO ELLIPTA) 100-25 MCG/ACT AEPB Inhale 1 puff into the lungs daily.   ibuprofen (ADVIL) 600 MG tablet Take 1 tablet (600 mg total) by mouth every 6 (six) hours as needed.   montelukast (SINGULAIR) 10 MG  tablet TAKE 1 TABLET BY MOUTH AT  BEDTIME   Multiple Vitamin (MULTIVITAMIN) tablet Take 1 tablet by mouth daily.   omeprazole (PRILOSEC) 40 MG capsule TAKE 1 CAPSULE BY MOUTH  DAILY   ondansetron (ZOFRAN ODT) 8 MG disintegrating tablet 1/2- 1 tablet q 8 hr prn nausea, vomiting   [DISCONTINUED] fluticasone-salmeterol (ADVAIR HFA) 230-21 MCG/ACT inhaler USE 2 PUFFS TWO TIMES DAILY   [DISCONTINUED] predniSONE (DELTASONE) 20 MG tablet Take 1 tablet (20 mg total) by mouth daily with breakfast.   Immunization History  Administered Date(s) Administered   Hep A / Hep B 10/09/2020, 11/07/2020, 04/09/2021   Influenza Split 07/15/2014   Influenza,inj,Quad PF,6+ Mos 07/07/2018   Influenza,inj,quad, With Preservative 07/14/2019   Influenza-Unspecified 07/01/2020, 06/23/2021   PFIZER(Purple Top)SARS-COV-2 Vaccination 10/29/2019, 11/21/2019       Objective:   Physical Exam BP 124/80 (BP Location: Left Arm, Cuff Size: Normal)   Pulse 83   Temp (!) 97.5 F (36.4 C) (Temporal)   Ht 5' 3"  (1.6 m)   Wt 187 lb 6.4 oz (85 kg)   SpO2 97%   BMI 33.20 kg/m  GENERAL: Overweight woman, well-groomed, no acute distress, fully ambulatory.  No conversational dyspnea. HEAD: Normocephalic, atraumatic.  EYES: Pupils equal, round, reactive to light.  No scleral icterus.  MOUTH: Nose/mouth/throat not examined due to masking requirements for COVID 19. NECK: Supple. No thyromegaly. Trachea midline. No JVD.  No adenopathy. PULMONARY: Good air entry bilaterally.  Mild end expiratory wheezes upper lung zones. CARDIOVASCULAR: S1 and S2. Regular rate and rhythm.  No rubs, murmurs or gallops heard.  ABDOMEN: Benign. MUSCULOSKELETAL: No joint deformity, no clubbing, no edema.  NEUROLOGIC: No focal deficit, no gait disturbance, speech is fluent. SKIN: Intact,warm,dry. PSYCH: Mood and behavior normal       Assessment & Plan:     ICD-10-CM   1. Moderate persistent asthma without complication  N23.55    Still with  some bronchospasm noted We will switch to Breo 100/25, 1 inhalation daily Hopefully this will help with compliance with the medication     2. Recurrent cough  R05.8    This has RESOLVED    3. Personal history of COVID-19  Z86.16    Cough was likely due to COVID-19 infection Now resolved     Meds ordered this encounter  Medications   fluticasone furoate-vilanterol (BREO ELLIPTA) 100-25 MCG/ACT AEPB    Sig: Inhale 1 puff into the lungs daily.    Dispense:  28 each    Refill:  11    Discontinue Advair.   albuterol (VENTOLIN HFA) 108 (90 Base) MCG/ACT inhaler    Sig: Inhale 2 puffs into the lungs every 6 (six) hours as needed for wheezing or shortness of breath.    Dispense:  8 g    Refill:  2   A[place]ella is overall doing well, we will switch her to Breo Ellipta 100/25 to see if this helps with compliance with her asthma  management regimen.  She has been provided a as needed albuterol.  She will discontinue Spiriva now that her cough has resolved (patient has not been using lately anyway).  We will see her in follow-up in 6 months time she is to contact us prior to that time should any new difficulties arise.  Renold Don, MD Advanced Bronchoscopy [facility]    *This note was dictated using voice recognition software/Dragon.  Despite best efforts to proofread, errors can occur which can change the meaning.  Any change was purely unintentional.

## 2021-08-06 NOTE — Patient Instructions (Addendum)
We are switching your Advair to [facility] 1 inhalation daily  This hopefully will help with keeping you on your maintenance medication.  You did have some wheezing noted today.  We have also sent a prescription for rescue inhaler.  You may stop taking the Spiriva.  We will see him in follow-up in 6 months time call sooner should any problems arise.

## 2021-09-19 ENCOUNTER — Other Ambulatory Visit: Payer: Self-pay | Admitting: Internal Medicine

## 2021-10-16 ENCOUNTER — Other Ambulatory Visit: Payer: Self-pay

## 2021-10-16 ENCOUNTER — Ambulatory Visit (INDEPENDENT_AMBULATORY_CARE_PROVIDER_SITE_OTHER): Payer: Managed Care, Other (non HMO) | Admitting: Internal Medicine

## 2021-10-16 ENCOUNTER — Encounter: Payer: Self-pay | Admitting: Internal Medicine

## 2021-10-16 VITALS — BP 118/70 | HR 83 | Temp 97.9°F | Resp 16 | Ht 63.0 in | Wt 185.2 lb

## 2021-10-16 DIAGNOSIS — K760 Fatty (change of) liver, not elsewhere classified: Secondary | ICD-10-CM | POA: Diagnosis not present

## 2021-10-16 DIAGNOSIS — Z9109 Other allergy status, other than to drugs and biological substances: Secondary | ICD-10-CM

## 2021-10-16 DIAGNOSIS — K2100 Gastro-esophageal reflux disease with esophagitis, without bleeding: Secondary | ICD-10-CM

## 2021-10-16 DIAGNOSIS — J4530 Mild persistent asthma, uncomplicated: Secondary | ICD-10-CM | POA: Diagnosis not present

## 2021-10-16 DIAGNOSIS — Z0000 Encounter for general adult medical examination without abnormal findings: Secondary | ICD-10-CM

## 2021-10-16 DIAGNOSIS — K5000 Crohn's disease of small intestine without complications: Secondary | ICD-10-CM

## 2021-10-16 DIAGNOSIS — F439 Reaction to severe stress, unspecified: Secondary | ICD-10-CM

## 2021-10-16 DIAGNOSIS — E041 Nontoxic single thyroid nodule: Secondary | ICD-10-CM

## 2021-10-16 MED ORDER — CITALOPRAM HYDROBROMIDE 20 MG PO TABS: ORAL_TABLET | ORAL | 1 refills | Status: DC

## 2021-10-16 NOTE — Assessment & Plan Note (Signed)
Physical today 10/16/21.  PAP 10/09/20 - negative with negative HPV.  colonoscopy 01/2019.

## 2021-10-16 NOTE — Progress Notes (Addendum)
Patient ID: Tasha Woods, female   DOB: 09/10/1970, 51 y.o.   MRN: [id number]   Subjective:    Patient ID: Tasha Woods, female    DOB: 07-04-1971, 51 y.o.   MRN: [id number]  This visit occurred during the SARS-CoV-2 public health emergency.  Safety protocols were in place, including screening questions prior to the visit, additional usage of staff PPE, and extensive cleaning of exam room while observing appropriate contact time as indicated for disinfecting solutions.   Patient here for her physical exam.   Chief Complaint  Patient presents with   Annual Exam   .   HPI Reports she is doing relatively well.  Recently evaluated by Dr Patsey Berthold for f/u of asthma and post covid cough.  Previous CT chest - mucous pluging and atelectasis ans well as air trapping c/w asthma.  Cough has resolved.  Changed to [facility].  Has albuterol inhaler if needed.  Recommended f/u in 6 months.  Breathing stable.  No chest pain.  No acid reflux or abdominal pain reported.  No bowel change reported.     Past Medical History:  Diagnosis Date   Allergy    Anxiety    Asthma    GERD (gastroesophageal reflux disease)    Past Surgical History:  Procedure Laterality Date   CHOLECYSTECTOMY  06/08/14   COLONOSCOPY  04-30-14   Dr Mal Misty EAR SURGERY     age 29   UPPER GI ENDOSCOPY  04-30-14   Dr Vira Agar   Family History  Problem Relation Age of Onset   Colon polyps Mother    Hypertension Father    Asthma Father    Cancer Father    Breast cancer Neg Hx    Colon cancer Neg Hx    Social History   Socioeconomic History   Marital status: Married    Spouse name: Not on file   Number of children: 2   Years of education: Not on file   Highest education level: Not on file  Occupational History    Employer: [employer]  Tobacco Use   Smoking status: Never   Smokeless tobacco: Never  Vaping Use   Vaping Use: Never used  Substance and Sexual Activity   Alcohol use: Yes    Alcohol/week: 0.0 standard  drinks    Comment: occasionally   Drug use: No   Sexual activity: Yes  Other Topics Concern   Not on file  Social History Narrative   Not on file   Social Determinants of Health   Financial Resource Strain: Not on file  Food Insecurity: Not on file  Transportation Needs: Not on file  Physical Activity: Not on file  Stress: Not on file  Social Connections: Not on file     Review of Systems  Constitutional:  Negative for appetite change and unexpected weight change.  HENT:  Negative for congestion, sinus pressure and sore throat.   Eyes:  Negative for pain and visual disturbance.  Respiratory:  Negative for cough, chest tightness and shortness of breath.   Cardiovascular:  Negative for chest pain, palpitations and leg swelling.  Gastrointestinal:  Negative for abdominal pain, diarrhea, nausea and vomiting.  Genitourinary:  Negative for difficulty urinating and dysuria.  Musculoskeletal:  Negative for joint swelling and myalgias.  Skin:  Negative for color change and rash.  Neurological:  Negative for dizziness, light-headedness and headaches.  Hematological:  Negative for adenopathy. Does not bruise/bleed easily.  Psychiatric/Behavioral:  Negative for agitation and dysphoric  mood.       Objective:     BP 118/70    Pulse 83    Temp 97.9 F (36.6 C)    Resp 16    Ht 5' 3"  (1.6 m)    Wt 185 lb 3.2 oz (84 kg)    SpO2 99%    BMI 32.81 kg/m  Wt Readings from Last 3 Encounters:  10/16/21 185 lb 3.2 oz (84 kg)  08/06/21 187 lb 6.4 oz (85 kg)  06/20/21 187 lb 12.8 oz (85.2 kg)    Physical Exam Vitals reviewed.  Constitutional:      General: She is not in acute distress.    Appearance: Normal appearance. She is well-developed.  HENT:     Head: Normocephalic and atraumatic.     Right Ear: External ear normal.     Left Ear: External ear normal.  Eyes:     General: No scleral icterus.       Right eye: No discharge.        Left eye: No discharge.     Conjunctiva/sclera:  Conjunctivae normal.  Neck:     Thyroid: No thyromegaly.  Cardiovascular:     Rate and Rhythm: Normal rate and regular rhythm.  Pulmonary:     Effort: No tachypnea, accessory muscle usage or respiratory distress.     Breath sounds: Normal breath sounds. No decreased breath sounds or wheezing.  Chest:  Breasts:    Right: No inverted nipple, mass, nipple discharge or tenderness (no axillary adenopathy).     Left: No inverted nipple, mass, nipple discharge or tenderness (no axilarry adenopathy).  Abdominal:     General: Bowel sounds are normal.     Palpations: Abdomen is soft.     Tenderness: There is no abdominal tenderness.  Musculoskeletal:        General: No swelling or tenderness.     Cervical back: Neck supple.  Lymphadenopathy:     Cervical: No cervical adenopathy.  Skin:    Findings: No erythema or rash.  Neurological:     Mental Status: She is alert and oriented to person, place, and time.  Psychiatric:        Mood and Affect: Mood normal.        Behavior: Behavior normal.     Outpatient Encounter Medications as of 10/16/2021  Medication Sig   albuterol (VENTOLIN HFA) 108 (90 Base) MCG/ACT inhaler Inhale 2 puffs into the lungs every 6 (six) hours as needed for wheezing or shortness of breath.   Ascorbic Acid (VITAMIN C) 1000 MG tablet Take 1,000 mg by mouth daily.   benzonatate (TESSALON) 200 MG capsule Take 1 capsule (200 mg total) by mouth 3 (three) times daily as needed for cough.   citalopram (CELEXA) 20 MG tablet Take 1 1/2 tablet q day   fluticasone (FLONASE) 50 MCG/ACT nasal spray USE 2 SPRAYS IN BOTH  NOSTRILS DAILY   fluticasone furoate-vilanterol (BREO ELLIPTA) 100-25 MCG/ACT AEPB Inhale 1 puff into the lungs daily.   ibuprofen (ADVIL) 600 MG tablet Take 1 tablet (600 mg total) by mouth every 6 (six) hours as needed.   montelukast (SINGULAIR) 10 MG tablet TAKE 1 TABLET BY MOUTH AT  BEDTIME   Multiple Vitamin (MULTIVITAMIN) tablet Take 1 tablet by mouth daily.    omeprazole (PRILOSEC) 40 MG capsule TAKE 1 CAPSULE BY MOUTH  DAILY   ondansetron (ZOFRAN ODT) 8 MG disintegrating tablet 1/2- 1 tablet q 8 hr prn nausea, vomiting   Tiotropium Bromide Monohydrate (  SPIRIVA RESPIMAT) 2.5 MCG/ACT AERS Inhale 2 puffs into the lungs daily.   [DISCONTINUED] citalopram (CELEXA) 20 MG tablet Take 1 1/2 tablet q day   No facility-administered encounter medications on file as of 10/16/2021.     Lab Results  Component Value Date   WBC 7.9 05/29/2021   HGB 13.0 05/29/2021   HCT 38.2 05/29/2021   PLT 246 05/29/2021   GLUCOSE 104 (H) 10/09/2020   CHOL 176 10/09/2020   TRIG 104 10/09/2020   HDL 51 10/09/2020   LDLCALC 106 (H) 10/09/2020   ALT 35 (H) 10/09/2020   AST 30 10/09/2020   NA 142 10/09/2020   K 4.6 10/09/2020   CL 105 10/09/2020   CREATININE 0.77 10/09/2020   BUN 17 10/09/2020   CO2 23 10/09/2020   TSH 2.670 10/09/2020   HGBA1C 5.3 03/31/2019    Pulmonary Function Test [facility] Only  Result Date: 06/12/2021 Spirometry Data Is Acceptable and Reproducible Moderate Restrictive Lung disease probable underlying reactive airways disease Suggestive of Hyperinflation with some BD response Consider outpatient Pulmonary Consultation if needed Clinical Correlation Advised       Assessment & Plan:   Problem List Items Addressed This Visit     Asthma    On Breo now.  Has albuterol inhaler if needed.  No cough or congestion.  Breathing stable.  Continue singulair.       Crohn's disease of small intestine without complication ([place])    Has seen GI. Documented history of crohn's.  Bowels stable.       Environmental allergies    Controlled.        Fatty liver disease, nonalcoholic    Follow liver panel.  Continue diet and exercise.        Relevant Orders   Hepatic function panel   Lipid panel   TSH   Basic metabolic panel   Health care maintenance    Physical today 10/16/21.  PAP 10/09/20 - negative with negative HPV.  colonoscopy 01/2019.        Reflux esophagitis    Doing well on prilosec.  Follow.  S/p EGD and colonoscopy.       Stress    Increased stress.  Work.  Discussed. On citalopram.  Does not feel needs any further intervention at this time.  Follow.       Thyroid nodule    Saw Dr Gabriel Carina 05/2021 - recommended f/u of 1.1cm nodule (left) - recommended f/u ultrasound in 6-12 months.  Follow tsh.       Other Visit Diagnoses     Routine general medical examination at a health care facility    -  Primary        Einar Pheasant, MD

## 2021-10-19 ENCOUNTER — Encounter: Payer: Self-pay | Admitting: Internal Medicine

## 2021-10-19 DIAGNOSIS — E041 Nontoxic single thyroid nodule: Secondary | ICD-10-CM | POA: Insufficient documentation

## 2021-10-19 NOTE — Assessment & Plan Note (Signed)
Saw Dr Gabriel Carina 05/2021 - recommended f/u of 1.1cm nodule (left) - recommended f/u ultrasound in 6-12 months.  Follow tsh.

## 2021-10-19 NOTE — Assessment & Plan Note (Signed)
Doing well on prilosec.  Follow.  S/p EGD and colonoscopy.

## 2021-10-19 NOTE — Assessment & Plan Note (Signed)
On Breo now.  Has albuterol inhaler if needed.  No cough or congestion.  Breathing stable.  Continue singulair.

## 2021-10-19 NOTE — Assessment & Plan Note (Signed)
Increased stress.  Work.  Discussed. On citalopram.  Does not feel needs any further intervention at this time.  Follow.

## 2021-10-19 NOTE — Assessment & Plan Note (Signed)
Controlled.  

## 2021-10-19 NOTE — Assessment & Plan Note (Signed)
Has seen GI. Documented history of crohn's.  Bowels stable.

## 2021-10-19 NOTE — Assessment & Plan Note (Signed)
Follow liver panel.  Continue diet and exercise.

## 2022-01-16 ENCOUNTER — Other Ambulatory Visit: Payer: Self-pay | Admitting: Internal Medicine

## 2022-02-06 ENCOUNTER — Other Ambulatory Visit: Payer: Self-pay | Admitting: Internal Medicine

## 2022-02-06 DIAGNOSIS — Z1231 Encounter for screening mammogram for malignant neoplasm of breast: Secondary | ICD-10-CM

## 2022-02-13 ENCOUNTER — Other Ambulatory Visit: Payer: Self-pay

## 2022-02-13 ENCOUNTER — Encounter: Payer: Self-pay | Admitting: Internal Medicine

## 2022-02-13 NOTE — Telephone Encounter (Signed)
Sent to Dr Nicki Reaper for approval

## 2022-02-14 MED ORDER — SCOPOLAMINE 1 MG/3DAYS TD PT72: 1.0000 | MEDICATED_PATCH | TRANSDERMAL | 0 refills | Status: AC

## 2022-02-14 NOTE — Telephone Encounter (Signed)
Rx ok'd for scopolamine patches.  Pt notified via my chart.

## 2022-02-16 ENCOUNTER — Other Ambulatory Visit: Payer: Self-pay | Admitting: Internal Medicine

## 2022-03-09 ENCOUNTER — Ambulatory Visit
Admission: RE | Admit: 2022-03-09 | Discharge: 2022-03-09 | Disposition: A | Discharge status: Home / Self Care | Payer: Managed Care, Other (non HMO) | Source: Ambulatory Visit | Attending: Internal Medicine | Admitting: Internal Medicine

## 2022-03-09 DIAGNOSIS — Z1231 Encounter for screening mammogram for malignant neoplasm of breast: Secondary | ICD-10-CM | POA: Insufficient documentation

## 2022-08-19 ENCOUNTER — Encounter: Payer: Self-pay | Admitting: Internal Medicine

## 2022-08-20 MED ORDER — OSELTAMIVIR PHOSPHATE 75 MG PO CAPS: 75.0000 mg | ORAL_CAPSULE | Freq: Two times a day (BID) | ORAL | 0 refills | Status: DC

## 2022-08-20 NOTE — Telephone Encounter (Signed)
Called and spoke to [place].  Symptoms started yesterday.  Increased congestion, aching and fever.  Son diagnosed with flu.  Requested tamiflu.  Has taken previously and tolerated.  Taking dayquil/nyquil.  Rx sent in for tamiflu.  Rest.  Fluids.  Discussed treating symptoms.  Tylenol, flonase and robitussin DM if needed.  Follow.  Call with update.

## 2022-11-10 ENCOUNTER — Encounter: Payer: Self-pay | Admitting: Internal Medicine

## 2022-11-10 ENCOUNTER — Telehealth: Payer: Self-pay | Admitting: Pulmonary Disease

## 2022-11-10 DIAGNOSIS — R0683 Snoring: Secondary | ICD-10-CM

## 2022-11-10 MED ORDER — ONDANSETRON HCL 4 MG PO TABS: 4.0000 mg | ORAL_TABLET | Freq: Two times a day (BID) | ORAL | 0 refills | Status: AC | PRN

## 2022-11-10 NOTE — Telephone Encounter (Signed)
Pt states dentist is suggesting mouth guard for teeth. Pt mentioned snoring and dentist is suggesting a sleep study.. Pt states she is not having any pulmonary concerns is asking if the study can be ordered?

## 2022-11-10 NOTE — Telephone Encounter (Signed)
I spoke with the patient. Her last Office Visit was in 2022. I scheduled her an appt with Tammy for 4/12 at 3:00pm.   Nothing further needed.

## 2022-11-10 NOTE — Telephone Encounter (Signed)
Rx sent in for zofran.  Regarding pulmonary referral, I am ok to place order, just need to confirm symptoms for diagnosis - for example - snoring, daytime somnolence, etc.

## 2022-11-23 ENCOUNTER — Encounter: Payer: Self-pay | Admitting: Internal Medicine

## 2022-11-24 NOTE — Telephone Encounter (Signed)
Patient returned office phone call and appointment has been made.

## 2022-11-24 NOTE — Telephone Encounter (Signed)
LMTCB. Please schedule patient appt with Dr Nicki Reaper for follow up. Has not been seen since 10/2021. Wants to discuss weight loss injections

## 2022-12-01 ENCOUNTER — Ambulatory Visit: Payer: Managed Care, Other (non HMO) | Admitting: Internal Medicine

## 2022-12-01 ENCOUNTER — Encounter: Payer: Self-pay | Admitting: Internal Medicine

## 2022-12-01 ENCOUNTER — Other Ambulatory Visit: Payer: Self-pay

## 2022-12-01 VITALS — BP 118/70 | HR 78 | Temp 98.3°F | Resp 16 | Ht 63.0 in | Wt 187.4 lb

## 2022-12-01 DIAGNOSIS — J4530 Mild persistent asthma, uncomplicated: Secondary | ICD-10-CM

## 2022-12-01 DIAGNOSIS — Z713 Dietary counseling and surveillance: Secondary | ICD-10-CM

## 2022-12-01 DIAGNOSIS — K2100 Gastro-esophageal reflux disease with esophagitis, without bleeding: Secondary | ICD-10-CM

## 2022-12-01 DIAGNOSIS — Z1231 Encounter for screening mammogram for malignant neoplasm of breast: Secondary | ICD-10-CM

## 2022-12-01 DIAGNOSIS — F439 Reaction to severe stress, unspecified: Secondary | ICD-10-CM

## 2022-12-01 DIAGNOSIS — Z83719 Family history of colon polyps, unspecified: Secondary | ICD-10-CM

## 2022-12-01 DIAGNOSIS — E041 Nontoxic single thyroid nodule: Secondary | ICD-10-CM

## 2022-12-01 DIAGNOSIS — K5000 Crohn's disease of small intestine without complications: Secondary | ICD-10-CM

## 2022-12-01 DIAGNOSIS — K760 Fatty (change of) liver, not elsewhere classified: Secondary | ICD-10-CM | POA: Diagnosis not present

## 2022-12-01 MED ORDER — CITALOPRAM HYDROBROMIDE 20 MG PO TABS: ORAL_TABLET | ORAL | 3 refills | Status: DC

## 2022-12-01 MED ORDER — ZEPBOUND 2.5 MG/0.5ML ~~LOC~~ SOAJ
2.5000 mg | SUBCUTANEOUS | 3 refills | Status: DC
Start: 2022-12-01 — End: 2023-01-18

## 2022-12-01 MED ORDER — MONTELUKAST SODIUM 10 MG PO TABS: 10.0000 mg | ORAL_TABLET | Freq: Every day | ORAL | 3 refills | Status: DC

## 2022-12-01 MED ORDER — ZEPBOUND 2.5 MG/0.5ML ~~LOC~~ SOAJ
2.5000 mg | SUBCUTANEOUS | 3 refills | Status: DC
Start: 2022-12-01 — End: 2022-12-01

## 2022-12-01 NOTE — Progress Notes (Signed)
Subjective:    Patient ID: Tasha Woods, female    DOB: May 28, 1971, 52 y.o.   MRN: [id number]  Patient here for  Chief Complaint  Patient presents with   Medical Management of Chronic Issues    HPI Here to follow up regarding asthma, reflux and stress.  On citalopram.  Evaluated by endocrinology (Dr Tedd SiasSolum) 02/24/22 - repeat neck ultrasound - thyroid nodules - stable.  No repeat ultrasound for surveillance recommended.  Here today to discuss weight loss and interested in starting. GLP-1 agonist. Has tried weight watchers.  Is walking.  Discussed diet and exercise.  Discussed weight loss medications.  Discussed possible side effects of GLP 1 agonist.     Past Medical History:  Diagnosis Date   Allergy    Anxiety    Asthma    GERD (gastroesophageal reflux disease)    Past Surgical History:  Procedure Laterality Date   CHOLECYSTECTOMY  06/08/14   COLONOSCOPY  04-30-14   Dr Lynden OxfordElliott   INNER EAR SURGERY     age 52   UPPER GI ENDOSCOPY  04-30-14   Dr Mechele CollinElliott   Family History  Problem Relation Age of Onset   Colon polyps Mother    Hypertension Father    Asthma Father    Cancer Father    Breast cancer Neg Hx    Colon cancer Neg Hx    Social History   Socioeconomic History   Marital status: Married    Spouse name: Not on file   Number of children: 2   Years of education: Not on file   Highest education level: Not on file  Occupational History    Employer: [employer]  Tobacco Use   Smoking status: Never   Smokeless tobacco: Never  Vaping Use   Vaping Use: Never used  Substance and Sexual Activity   Alcohol use: Yes    Alcohol/week: 0.0 standard drinks of alcohol    Comment: occasionally   Drug use: No   Sexual activity: Yes  Other Topics Concern   Not on file  Social History Narrative   Not on file   Social Determinants of Health   Financial Resource Strain: Not on file  Food Insecurity: Not on file  Transportation Needs: Not on file  Physical Activity: Not on  file  Stress: Not on file  Social Connections: Not on file     Review of Systems  Constitutional:  Negative for appetite change and unexpected weight change.  HENT:  Negative for congestion and sinus pressure.   Respiratory:  Negative for cough, chest tightness and shortness of breath.   Cardiovascular:  Negative for chest pain and palpitations.  Gastrointestinal:  Negative for abdominal pain, diarrhea, nausea and vomiting.  Genitourinary:  Negative for difficulty urinating and dysuria.  Musculoskeletal:  Negative for joint swelling and myalgias.  Skin:  Negative for color change and rash.  Neurological:  Negative for dizziness and headaches.  Psychiatric/Behavioral:  Negative for agitation and dysphoric mood.        Objective:     BP 118/70   Pulse 78   Temp 98.3 F (36.8 C)   Resp 16   Ht 5\' 3"  (1.6 m)   Wt 187 lb 6.4 oz (85 kg)   SpO2 98%   BMI 33.20 kg/m  Wt Readings from Last 3 Encounters:  12/01/22 187 lb 6.4 oz (85 kg)  10/16/21 185 lb 3.2 oz (84 kg)  08/06/21 187 lb 6.4 oz (85 kg)  Physical Exam Vitals reviewed.  Constitutional:      General: She is not in acute distress.    Appearance: Normal appearance. She is well-developed.  HENT:     Head: Normocephalic and atraumatic.     Right Ear: External ear normal.     Left Ear: External ear normal.  Eyes:     General: No scleral icterus.       Right eye: No discharge.        Left eye: No discharge.     Conjunctiva/sclera: Conjunctivae normal.  Neck:     Thyroid: No thyromegaly.  Cardiovascular:     Rate and Rhythm: Normal rate and regular rhythm.  Pulmonary:     Effort: No tachypnea, accessory muscle usage or respiratory distress.     Breath sounds: Normal breath sounds. No decreased breath sounds or wheezing.  Chest:  Breasts:    Right: No inverted nipple, mass, nipple discharge or tenderness (no axillary adenopathy).     Left: No inverted nipple, mass, nipple discharge or tenderness (no axilarry  adenopathy).  Abdominal:     General: Bowel sounds are normal.     Palpations: Abdomen is soft.     Tenderness: There is no abdominal tenderness.  Musculoskeletal:        General: No swelling or tenderness.     Cervical back: Neck supple. No tenderness.  Lymphadenopathy:     Cervical: No cervical adenopathy.  Skin:    Findings: No erythema or rash.  Neurological:     Mental Status: She is alert and oriented to person, place, and time.  Psychiatric:        Mood and Affect: Mood normal.        Behavior: Behavior normal.      Outpatient Encounter Medications as of 12/01/2022  Medication Sig   [DISCONTINUED] tirzepatide (ZEPBOUND) 2.5 MG/0.5ML Pen Inject 2.5 mg into the skin once a week.   albuterol (VENTOLIN HFA) 108 (90 Base) MCG/ACT inhaler Inhale 2 puffs into the lungs every 6 (six) hours as needed for wheezing or shortness of breath.   Ascorbic Acid (VITAMIN C) 1000 MG tablet Take 1,000 mg by mouth daily.   citalopram (CELEXA) 20 MG tablet TAKE 1 TABLET BY MOUTH  DAILY   fluticasone (FLONASE) 50 MCG/ACT nasal spray USE 2 SPRAYS IN BOTH  NOSTRILS DAILY   fluticasone furoate-vilanterol (BREO ELLIPTA) 100-25 MCG/ACT AEPB Inhale 1 puff into the lungs daily.   ibuprofen (ADVIL) 600 MG tablet Take 1 tablet (600 mg total) by mouth every 6 (six) hours as needed.   montelukast (SINGULAIR) 10 MG tablet Take 1 tablet (10 mg total) by mouth at bedtime.   Multiple Vitamin (MULTIVITAMIN) tablet Take 1 tablet by mouth daily.   omeprazole (PRILOSEC) 40 MG capsule TAKE 1 CAPSULE BY MOUTH  DAILY   ondansetron (ZOFRAN ODT) 8 MG disintegrating tablet 1/2- 1 tablet q 8 hr prn nausea, vomiting   ondansetron (ZOFRAN) 4 MG tablet Take 1 tablet (4 mg total) by mouth 2 (two) times daily as needed for nausea or vomiting.   scopolamine (TRANSDERM-SCOP) 1 MG/3DAYS Place 1 patch (1.5 mg total) onto the skin every 3 (three) days.   Tiotropium Bromide Monohydrate (SPIRIVA RESPIMAT) 2.5 MCG/ACT AERS Inhale 2 puffs  into the lungs daily.   [DISCONTINUED] citalopram (CELEXA) 20 MG tablet TAKE 1 TABLET BY MOUTH  DAILY   [DISCONTINUED] montelukast (SINGULAIR) 10 MG tablet TAKE 1 TABLET BY MOUTH AT  BEDTIME   [DISCONTINUED] oseltamivir (TAMIFLU) 75 MG capsule  Take 1 capsule (75 mg total) by mouth 2 (two) times daily.   No facility-administered encounter medications on file as of 12/01/2022.     Lab Results  Component Value Date   WBC 5.9 12/01/2022   HGB 12.3 12/01/2022   HCT 38.5 12/01/2022   PLT 226 12/01/2022   GLUCOSE 98 12/01/2022   CHOL 181 12/01/2022   TRIG 81 12/01/2022   HDL 58 12/01/2022   LDLCALC 108 (H) 12/01/2022   ALT 28 12/01/2022   AST 28 12/01/2022   NA 143 12/01/2022   K 4.8 12/01/2022   CL 106 12/01/2022   CREATININE 0.75 12/01/2022   BUN 18 12/01/2022   CO2 24 12/01/2022   TSH 1.740 12/01/2022   HGBA1C 5.3 03/31/2019    MM 3D SCREEN BREAST BILATERAL  Result Date: 03/09/2022 CLINICAL DATA:  Screening. EXAM: DIGITAL SCREENING BILATERAL MAMMOGRAM WITH TOMOSYNTHESIS AND CAD TECHNIQUE: Bilateral screening digital craniocaudal and mediolateral oblique mammograms were obtained. Bilateral screening digital breast tomosynthesis was performed. The images were evaluated with computer-aided detection. COMPARISON:  Previous exam(s). ACR Breast Density Category b: There are scattered areas of fibroglandular density. FINDINGS: There are no findings suspicious for malignancy. IMPRESSION: No mammographic evidence of malignancy. A result letter of this screening mammogram will be mailed directly to the patient. RECOMMENDATION: Screening mammogram in one year. (Code:SM-B-01Y) BI-RADS CATEGORY  1: Negative. Electronically Signed   By: Sherian Rein M.D.   On: 03/09/2022 10:43       Assessment & Plan:  Thyroid nodule Assessment & Plan: Evaluated by endocrinology (Dr Tedd Sias) 02/24/22 - repeat neck ultrasound - thyroid nodules - stable.  No repeat ultrasound for surveillance recommended.    Orders: -     TSH  Fatty liver disease, nonalcoholic Assessment & Plan: Follow liver panel.  Continue diet and exercise.    Orders: -     Basic metabolic panel -     CBC with Differential/Platelet -     Hepatic function panel -     Lipid panel  Visit for screening mammogram -     3D Screening Mammogram, Left and Right; Future  Mild persistent asthma, unspecified whether complicated Assessment & Plan: On Breo now.  Has albuterol inhaler if needed.  No cough or congestion.  Breathing stable.  Continue singulair. Previously saw pulmonary.  Note reviewed.  Feels breathing stable.    Crohn's disease of small intestine without complication Assessment & Plan: Has seen GI. Documented history of crohn's.  Bowels stable.    Family history of colonic polyps Assessment & Plan: Colonoscopy 01/31/19.  Recommended f/u in 5 years.    Gastroesophageal reflux disease with esophagitis without hemorrhage Assessment & Plan: Doing well on prilosec.  Follow.  S/p EGD and colonoscopy.    Stress Assessment & Plan: On citalopram.  Does not feel needs any further intervention at this time.  Follow.    Weight loss counseling, encounter for Assessment & Plan: Has tried weight watchers.  Is walking.  Discussed diet and exercise.   Discussed weight loss medications.  Discussed possible side effects of GLP 1 agonist.  Desires to start zepbound.    Other orders -     Citalopram Hydrobromide; TAKE 1 TABLET BY MOUTH  DAILY  Dispense: 90 tablet; Refill: 3 -     Montelukast Sodium; Take 1 tablet (10 mg total) by mouth at bedtime.  Dispense: 90 tablet; Refill: 3     Dale Marion, MD

## 2022-12-02 LAB — CBC WITH DIFFERENTIAL/PLATELET
Basophils Absolute: 0 10*3/uL (ref 0.0–0.2)
Basos: 1 %
EOS (ABSOLUTE): 0.2 10*3/uL (ref 0.0–0.4)
Eos: 4 %
Hematocrit: 38.5 % (ref 34.0–46.6)
Hemoglobin: 12.3 g/dL (ref 11.1–15.9)
Immature Grans (Abs): 0 10*3/uL (ref 0.0–0.1)
Immature Granulocytes: 0 %
Lymphocytes Absolute: 1.8 10*3/uL (ref 0.7–3.1)
Lymphs: 31 %
MCH: 27.7 pg (ref 26.6–33.0)
MCHC: 31.9 g/dL (ref 31.5–35.7)
MCV: 87 fL (ref 79–97)
Monocytes Absolute: 0.4 10*3/uL (ref 0.1–0.9)
Monocytes: 6 %
Neutrophils Absolute: 3.4 10*3/uL (ref 1.4–7.0)
Neutrophils: 58 %
Platelets: 226 10*3/uL (ref 150–450)
RBC: 4.44 x10E6/uL (ref 3.77–5.28)
RDW: 12.8 % (ref 11.7–15.4)
WBC: 5.9 10*3/uL (ref 3.4–10.8)

## 2022-12-02 LAB — HEPATIC FUNCTION PANEL
ALT: 28 IU/L (ref 0–32)
AST: 28 IU/L (ref 0–40)
Albumin: 4.6 g/dL (ref 3.8–4.9)
Alkaline Phosphatase: 90 IU/L (ref 44–121)
Bilirubin Total: 0.5 mg/dL (ref 0.0–1.2)
Bilirubin, Direct: 0.14 mg/dL (ref 0.00–0.40)
Total Protein: 6.4 g/dL (ref 6.0–8.5)

## 2022-12-02 LAB — BASIC METABOLIC PANEL
BUN/Creatinine Ratio: 24 — ABNORMAL HIGH (ref 9–23)
BUN: 18 mg/dL (ref 6–24)
CO2: 24 mmol/L (ref 20–29)
Calcium: 9.5 mg/dL (ref 8.7–10.2)
Chloride: 106 mmol/L (ref 96–106)
Creatinine, Ser: 0.75 mg/dL (ref 0.57–1.00)
Glucose: 98 mg/dL (ref 70–99)
Potassium: 4.8 mmol/L (ref 3.5–5.2)
Sodium: 143 mmol/L (ref 134–144)
eGFR: 95 mL/min/{1.73_m2} (ref >59)

## 2022-12-02 LAB — TSH: TSH: 1.74 u[IU]/mL (ref 0.450–4.500)

## 2022-12-02 LAB — LIPID PANEL
Chol/HDL Ratio: 3.1 ratio (ref 0.0–4.4)
Cholesterol, Total: 181 mg/dL (ref 100–199)
HDL: 58 mg/dL (ref >39)
LDL Chol Calc (NIH): 108 mg/dL — ABNORMAL HIGH (ref 0–99)
Triglycerides: 81 mg/dL (ref 0–149)
VLDL Cholesterol Cal: 15 mg/dL (ref 5–40)

## 2022-12-03 ENCOUNTER — Encounter: Payer: Self-pay | Admitting: Internal Medicine

## 2022-12-04 MED ORDER — WEGOVY 0.25 MG/0.5ML ~~LOC~~ SOAJ: 0.2500 mg | SUBCUTANEOUS | 2 refills | Status: DC

## 2022-12-04 NOTE — Telephone Encounter (Signed)
Ok to change to [employer] .25mg  q week.

## 2022-12-06 ENCOUNTER — Encounter: Payer: Self-pay | Admitting: Internal Medicine

## 2022-12-06 DIAGNOSIS — Z713 Dietary counseling and surveillance: Secondary | ICD-10-CM | POA: Insufficient documentation

## 2022-12-06 NOTE — Assessment & Plan Note (Signed)
Has seen GI. Documented history of crohn's.  Bowels stable.  °

## 2022-12-06 NOTE — Assessment & Plan Note (Signed)
Doing well on prilosec.  Follow.  S/p EGD and colonoscopy.  

## 2022-12-06 NOTE — Assessment & Plan Note (Signed)
Evaluated by endocrinology (Dr Tedd Sias) 02/24/22 - repeat neck ultrasound - thyroid nodules - stable.  No repeat ultrasound for surveillance recommended.

## 2022-12-06 NOTE — Assessment & Plan Note (Signed)
Has tried weight watchers.  Is walking.  Discussed diet and exercise.   Discussed weight loss medications.  Discussed possible side effects of GLP 1 agonist.  Desires to start zepbound.

## 2022-12-06 NOTE — Assessment & Plan Note (Signed)
Colonoscopy 01/31/19.  Recommended f/u in 5 years.

## 2022-12-06 NOTE — Assessment & Plan Note (Signed)
Follow liver panel.  Continue diet and exercise.   °

## 2022-12-06 NOTE — Assessment & Plan Note (Signed)
On citalopram.  Does not feel needs any further intervention at this time.  Follow.

## 2022-12-06 NOTE — Assessment & Plan Note (Signed)
On Breo now.  Has albuterol inhaler if needed.  No cough or congestion.  Breathing stable.  Continue singulair. Previously saw pulmonary.  Note reviewed.  Feels breathing stable.

## 2022-12-09 ENCOUNTER — Telehealth: Payer: Self-pay

## 2022-12-09 ENCOUNTER — Other Ambulatory Visit (HOSPITAL_COMMUNITY): Payer: Self-pay

## 2022-12-09 NOTE — Telephone Encounter (Signed)
Pharmacy Patient Advocate Encounter   Received notification from Karin Golden that prior authorization for [facility] 0.25MG /0.5ML auto-injectors is required/requested.   PA submitted on 12/09/22 to (ins) [employer] via CoverMyMeds Key or [facility]) confirmation # [id number] Status is pending

## 2022-12-10 ENCOUNTER — Telehealth: Payer: Self-pay

## 2022-12-10 NOTE — Telephone Encounter (Signed)
Patient Advocate Encounter  Prior Authorization for [employer] 0.25MG /0.5ML auto-injectors  has been approved.    PA# [id number] Effective dates: 12/09/22 through 07/11/23

## 2022-12-10 NOTE — Telephone Encounter (Signed)
Spoke to patient and verified that she has not had previous sleep study.

## 2022-12-11 ENCOUNTER — Encounter: Payer: Self-pay | Admitting: Adult Health

## 2022-12-11 ENCOUNTER — Ambulatory Visit: Payer: Managed Care, Other (non HMO) | Admitting: Adult Health

## 2022-12-11 VITALS — BP 128/76 | HR 72 | Temp 97.7°F | Ht 63.0 in | Wt 186.4 lb

## 2022-12-11 DIAGNOSIS — R0683 Snoring: Secondary | ICD-10-CM | POA: Diagnosis not present

## 2022-12-11 DIAGNOSIS — J4530 Mild persistent asthma, uncomplicated: Secondary | ICD-10-CM

## 2022-12-11 NOTE — Progress Notes (Signed)
@Patient  ID: Tasha Woods, female    DOB: 1971-01-21, 52 y.o.   MRN: [id number]  Chief Complaint  Patient presents with   sleep consult    Referring provider: Dale Cantwell, MD  HPI: 52 year old female never smoker followed for Asthma   TEST/EVENTS :  FEV1 79%, ratio 89, FVC 70%, no significant bronchodilator response., Mid flow reversibility. DLCO was normal at 103%.   10/24/2020 High res CT: No interstitial lung disease.  Air trapping consistent with small airways disease. Minimal scarring noted particularly in the lingula.   12/11/2022 Follow up ; Asthma and Snoring  Patient presents for follow-up visit.  Last seen in the office December 2022.  Patient has a history of mild persistent asthma.  Says she has been doing well on Breo.  Says she does take everyday .  She takes Singulair daily.  She has had no increased albuterol use. No longer using Spiriva.   Patient does complain of snoring, restless sleep and daytime sleepiness.  Says she has been referred by her primary care provider Dr. Lorin Picket for possible underlying sleep apnea.  Typically goes to bed about 11 PM.  Takes up to 30 minutes to go to sleep.  Is up several times throughout the night.  Gets up at 5:30 AM.  Weight is up by 10 pounds over the last 2 years.  Current weight is at 196 pounds with a BMI of 33.  No symptoms suspicious for cataplexy or sleep paralysis.  Minimal caffeine intake.  Denies any known history of congestive heart failure or stroke. Takes otc sleep aide every few months only. Tired all time. Wakes herself up snoring.  No removeable dental work. Using a otc mouth guard , is helping some but still snoring. Epworth is 5 out of 24 , typically gets sleepy if she sits down to read and in the afternoon hours.    Allergies  Allergen Reactions   Ceftin [Cefuroxime Axetil] Rash   Erythromycin Rash   Penicillins Rash   Sulfa Antibiotics Rash    Immunization History  Administered Date(s) Administered   Hep A  / Hep B 10/09/2020, 11/07/2020, 04/09/2021   Influenza Split 07/15/2014   Influenza,inj,Quad PF,6+ Mos 07/07/2018   Influenza,inj,quad, With Preservative 07/14/2019   Influenza-Unspecified 07/01/2020, 06/23/2021   PFIZER(Purple Top)SARS-COV-2 Vaccination 10/29/2019, 11/21/2019, 08/12/2020    Past Medical History:  Diagnosis Date   Allergy    Anxiety    Asthma    GERD (gastroesophageal reflux disease)     Tobacco History: Social History   Tobacco Use  Smoking Status Never  Smokeless Tobacco Never   Counseling given: Not Answered   Outpatient Medications Prior to Visit  Medication Sig Dispense Refill   albuterol (VENTOLIN HFA) 108 (90 Base) MCG/ACT inhaler Inhale 2 puffs into the lungs every 6 (six) hours as needed for wheezing or shortness of breath. 8 g 2   Ascorbic Acid (VITAMIN C) 1000 MG tablet Take 1,000 mg by mouth daily.     citalopram (CELEXA) 20 MG tablet TAKE 1 TABLET BY MOUTH  DAILY 90 tablet 3   fluticasone (FLONASE) 50 MCG/ACT nasal spray USE 2 SPRAYS IN BOTH  NOSTRILS DAILY 48 g 1   fluticasone furoate-vilanterol (BREO ELLIPTA) 100-25 MCG/ACT AEPB Inhale 1 puff into the lungs daily. 28 each 11   ibuprofen (ADVIL) 600 MG tablet Take 1 tablet (600 mg total) by mouth every 6 (six) hours as needed. 30 tablet 0   montelukast (SINGULAIR) 10 MG tablet Take 1 tablet (  10 mg total) by mouth at bedtime. 90 tablet 3   Multiple Vitamin (MULTIVITAMIN) tablet Take 1 tablet by mouth daily.     omeprazole (PRILOSEC) 40 MG capsule TAKE 1 CAPSULE BY MOUTH  DAILY 90 capsule 3   ondansetron (ZOFRAN) 4 MG tablet Take 1 tablet (4 mg total) by mouth 2 (two) times daily as needed for nausea or vomiting. 14 tablet 0   scopolamine (TRANSDERM-SCOP) 1 MG/3DAYS Place 1 patch (1.5 mg total) onto the skin every 3 (three) days. 10 patch 0   Semaglutide-Weight Management (WEGOVY) 0.25 MG/0.5ML SOAJ Inject 0.25 mg into the skin once a week. 2 mL 2   Tiotropium Bromide Monohydrate (SPIRIVA RESPIMAT)  2.5 MCG/ACT AERS Inhale 2 puffs into the lungs daily. 1 each 5   ondansetron (ZOFRAN ODT) 8 MG disintegrating tablet 1/2- 1 tablet q 8 hr prn nausea, vomiting (Patient not taking: Reported on 12/11/2022) 20 tablet 0   tirzepatide (ZEPBOUND) 2.5 MG/0.5ML Pen Inject 2.5 mg into the skin once a week. (Patient not taking: Reported on 12/11/2022) 2 mL 3   No facility-administered medications prior to visit.     Review of Systems:   Constitutional:   No  weight loss, night sweats,  Fevers, chills,  +fatigue, or  lassitude.  HEENT:   No headaches,  Difficulty swallowing,  Tooth/dental problems, or  Sore throat,                No sneezing, itching, ear ache, nasal congestion, post nasal drip,   CV:  No chest pain,  Orthopnea, PND, swelling in lower extremities, anasarca, dizziness, palpitations, syncope.   GI  No heartburn, indigestion, abdominal pain, nausea, vomiting, diarrhea, change in bowel habits, loss of appetite, bloody stools.   Resp: No shortness of breath with exertion or at rest.  No excess mucus, no productive cough,  No non-productive cough,  No coughing up of blood.  No change in color of mucus.  No wheezing.  No chest wall deformity  Skin: no rash or lesions.  GU: no dysuria, change in color of urine, no urgency or frequency.  No flank pain, no hematuria   MS:  No joint pain or swelling.  No decreased range of motion.  No back pain.    Physical Exam  BP 128/76 (BP Location: Left Arm, Cuff Size: Normal)   Pulse 72   Temp 97.7 F (36.5 C) (Temporal)   Ht  (1.6 m)   Wt 186 lb 6.4 oz (84.6 kg)   SpO2 96%   BMI 33.02 kg/m   GEN: A/Ox3; pleasant , NAD, well nourished    HEENT:  [place]/AT,   NOSE-clear, THROAT-clear, no lesions, no postnasal drip or exudate noted.  Class 3 MP airway   NECK:  Supple w/ fair ROM; no JVD; normal carotid impulses w/o bruits; no thyromegaly or nodules palpated; no lymphadenopathy.    RESP  Clear  P & A; w/o, wheezes/ rales/ or rhonchi. no  accessory muscle use, no dullness to percussion  CARD:  RRR, no m/r/g, no peripheral edema, pulses intact, no cyanosis or clubbing.  GI:   Soft & nt; nml bowel sounds; no organomegaly or masses detected.   Musco: Warm bil, no deformities or joint swelling noted.   Neuro: alert, no focal deficits noted.    Skin: Warm, no lesions or rashes    Lab Results:  CBC   ProBNP No results found for: "PROBNP"  Imaging: No results found.  No data to display          No results found for: "NITRICOXIDE"      Assessment & Plan:   Asthma Mild intermittent/persistent asthma doing very well.  Patient can continue on current regimen.  Asthma action plan discussed.  Breo daily as taking.  Singulair daily.  Plan  Patient Instructions  Asthma action plan as discussed.  Continue on BREO 1 puff daily , rinse after use.  Continue Singulair daily Albuterol inhaler as needed  Set up for home sleep study Healthy sleep regimen Do not if sleepy Follow-up in 6 weeks and as needed     Snoring Snoring, restless sleep, daytime sleepiness all suspicious for underlying sleep apnea.  Patient education given on sleep apnea.  Will set up for home sleep study  - discussed how weight can impact sleep and risk for sleep disordered breathing - discussed options to assist with weight loss: combination of diet modification, cardiovascular and strength training exercises   - had an extensive discussion regarding the adverse health consequences related to untreated sleep disordered breathing - specifically discussed the risks for hypertension, coronary artery disease, cardiac dysrhythmias, cerebrovascular disease, and diabetes - lifestyle modification discussed   - discussed how sleep disruption can increase risk of accidents, particularly when driving - safe driving practices were discussed   Plan  Patient Instructions  Asthma action plan as discussed.  Continue on BREO 1 puff daily  , rinse after use.  Continue Singulair daily Albuterol inhaler as needed  Set up for home sleep study Healthy sleep regimen Do not if sleepy Follow-up in 6 weeks and as needed       Rubye Oaks, NP 12/11/2022

## 2022-12-11 NOTE — Patient Instructions (Addendum)
Asthma action plan as discussed.  Continue on BREO 1 puff daily , rinse after use.  Continue Singulair daily Albuterol inhaler as needed  Set up for home sleep study Healthy sleep regimen Do not if sleepy Follow-up in 6 weeks and as needed

## 2022-12-11 NOTE — Assessment & Plan Note (Signed)
Mild intermittent/persistent asthma doing very well.  Patient can continue on current regimen.  Asthma action plan discussed.  Breo daily as taking.  Singulair daily.  Plan  Patient Instructions  Asthma action plan as discussed.  Continue on BREO 1 puff daily , rinse after use.  Continue Singulair daily Albuterol inhaler as needed  Set up for home sleep study Healthy sleep regimen Do not if sleepy Follow-up in 6 weeks and as needed

## 2022-12-11 NOTE — Progress Notes (Signed)
Reviewed and agree with assessment/plan.   Coralyn Helling, MD [facility] Pulmonary/Critical Care 12/11/2022, 4:39 PM Pager:  [phone]

## 2022-12-11 NOTE — Assessment & Plan Note (Signed)
Snoring, restless sleep, daytime sleepiness all suspicious for underlying sleep apnea.  Patient education given on sleep apnea.  Will set up for home sleep study  - discussed how weight can impact sleep and risk for sleep disordered breathing - discussed options to assist with weight loss: combination of diet modification, cardiovascular and strength training exercises   - had an extensive discussion regarding the adverse health consequences related to untreated sleep disordered breathing - specifically discussed the risks for hypertension, coronary artery disease, cardiac dysrhythmias, cerebrovascular disease, and diabetes - lifestyle modification discussed   - discussed how sleep disruption can increase risk of accidents, particularly when driving - safe driving practices were discussed   Plan  Patient Instructions  Asthma action plan as discussed.  Continue on BREO 1 puff daily , rinse after use.  Continue Singulair daily Albuterol inhaler as needed  Set up for home sleep study Healthy sleep regimen Do not if sleepy Follow-up in 6 weeks and as needed

## 2022-12-13 ENCOUNTER — Other Ambulatory Visit: Payer: Self-pay | Admitting: Internal Medicine

## 2022-12-14 NOTE — Telephone Encounter (Signed)
Please refuse. I refilled for 1 year at her most recent appt.

## 2023-01-09 ENCOUNTER — Telehealth: Payer: Self-pay

## 2023-01-09 NOTE — Telephone Encounter (Signed)
Prior Auth for Tasha Woods has been sent to plan today    Tasha Woods (Key: [id number]) Rx #: [id number]  YQMVHQ 0.25MG /0.5ML auto-injectors  Form [employer] Electronic Prior Authorization Form [phone] NCPDP)

## 2023-01-11 NOTE — Telephone Encounter (Signed)
PA for Reginal Lutes was already approved through 07/2023

## 2023-01-15 ENCOUNTER — Encounter: Payer: Self-pay | Admitting: Internal Medicine

## 2023-01-15 NOTE — Telephone Encounter (Signed)
Ok to switch her to zepbound 5 mg 90 day supply?

## 2023-01-15 NOTE — Telephone Encounter (Signed)
Ok to change to zepbound and recommend starting with 2.5mg .

## 2023-01-16 ENCOUNTER — Other Ambulatory Visit: Payer: Self-pay | Admitting: Internal Medicine

## 2023-01-18 MED ORDER — ZEPBOUND 2.5 MG/0.5ML ~~LOC~~ SOAJ
2.5000 mg | SUBCUTANEOUS | 0 refills | Status: DC
Start: 2023-01-18 — End: 2023-03-03

## 2023-01-27 ENCOUNTER — Telehealth: Payer: Self-pay

## 2023-01-27 ENCOUNTER — Other Ambulatory Visit (HOSPITAL_COMMUNITY): Payer: Self-pay

## 2023-01-27 NOTE — Telephone Encounter (Signed)
Patient needs PA on zepbound 2.5 mg

## 2023-01-27 NOTE — Telephone Encounter (Signed)
Pharmacy Patient Advocate Encounter   Received notification that prior authorization for Zepbound 2.5MG /0.5ML pen-injectors is required/requested.   PA submitted on 01/27/23 to (ins) [employer] via CoverMyMeds Key or (Medicaid) confirmation # [id number] Status is pending

## 2023-01-28 ENCOUNTER — Ambulatory Visit: Payer: Managed Care, Other (non HMO) | Admitting: Adult Health

## 2023-01-29 ENCOUNTER — Other Ambulatory Visit (HOSPITAL_COMMUNITY): Payer: Self-pay

## 2023-01-29 NOTE — Telephone Encounter (Signed)
Patient Advocate Encounter  Appeal for Zepbound 2.5mg  has been approved with [employer].    Case# [id number] Effective dates: 01/27/23 through 07/31/23

## 2023-01-29 NOTE — Telephone Encounter (Signed)
PA was denied. Submitted eAppeal via CMM. Key: Abby Potash

## 2023-01-29 NOTE — Telephone Encounter (Signed)
See other note

## 2023-01-29 NOTE — Telephone Encounter (Signed)
Mychart sent to pt.

## 2023-02-01 ENCOUNTER — Encounter: Payer: Self-pay | Admitting: Internal Medicine

## 2023-02-01 ENCOUNTER — Ambulatory Visit (INDEPENDENT_AMBULATORY_CARE_PROVIDER_SITE_OTHER): Payer: Managed Care, Other (non HMO) | Admitting: Internal Medicine

## 2023-02-01 VITALS — BP 118/70 | HR 68 | Temp 98.2°F | Resp 16 | Ht 63.0 in | Wt 181.2 lb

## 2023-02-01 DIAGNOSIS — E041 Nontoxic single thyroid nodule: Secondary | ICD-10-CM

## 2023-02-01 DIAGNOSIS — F439 Reaction to severe stress, unspecified: Secondary | ICD-10-CM

## 2023-02-01 DIAGNOSIS — K5000 Crohn's disease of small intestine without complications: Secondary | ICD-10-CM

## 2023-02-01 DIAGNOSIS — K2100 Gastro-esophageal reflux disease with esophagitis, without bleeding: Secondary | ICD-10-CM

## 2023-02-01 DIAGNOSIS — K760 Fatty (change of) liver, not elsewhere classified: Secondary | ICD-10-CM

## 2023-02-01 DIAGNOSIS — Z83719 Family history of colon polyps, unspecified: Secondary | ICD-10-CM | POA: Diagnosis not present

## 2023-02-01 DIAGNOSIS — J4530 Mild persistent asthma, uncomplicated: Secondary | ICD-10-CM

## 2023-02-01 DIAGNOSIS — Z0000 Encounter for general adult medical examination without abnormal findings: Secondary | ICD-10-CM | POA: Diagnosis not present

## 2023-02-01 DIAGNOSIS — Z713 Dietary counseling and surveillance: Secondary | ICD-10-CM

## 2023-02-01 NOTE — Progress Notes (Signed)
Subjective:    Patient ID: Tasha Woods, female    DOB: 05-25-1971, 52 y.o.   MRN: [id number]  Patient here for  Chief Complaint  Patient presents with   Annual Exam    HPI Here for a physical exam. On citalopram for increased stress.  Recently discussed weight loss and treatment options.  Discussed starting zepbound. Per report, zepbound has been approved by insurance.  Reports she is doing well.  No chest pain or sob reported.  No abdominal pain or bowel change reported.  Saw pulmonary 12/11/22.  Recommended HST.    Past Medical History:  Diagnosis Date   Allergy    Anxiety    Asthma    GERD (gastroesophageal reflux disease)    Past Surgical History:  Procedure Laterality Date   CHOLECYSTECTOMY  06/08/14   COLONOSCOPY  04-30-14   Dr Lynden Oxford EAR SURGERY     age 89   UPPER GI ENDOSCOPY  04-30-14   Dr Mechele Collin   Family History  Problem Relation Age of Onset   Colon polyps Mother    Hypertension Father    Asthma Father    Cancer Father    Breast cancer Neg Hx    Colon cancer Neg Hx    Social History   Socioeconomic History   Marital status: Married    Spouse name: Not on file   Number of children: 2   Years of education: Not on file   Highest education level: Not on file  Occupational History    Employer: [employer]  Tobacco Use   Smoking status: Never   Smokeless tobacco: Never  Vaping Use   Vaping Use: Never used  Substance and Sexual Activity   Alcohol use: Yes    Alcohol/week: 0.0 standard drinks of alcohol    Comment: occasionally   Drug use: No   Sexual activity: Yes  Other Topics Concern   Not on file  Social History Narrative   Not on file   Social Determinants of Health   Financial Resource Strain: Not on file  Food Insecurity: Not on file  Transportation Needs: Not on file  Physical Activity: Not on file  Stress: Not on file  Social Connections: Not on file     Review of Systems  Constitutional:  Negative for appetite change  and unexpected weight change.  HENT:  Negative for congestion, sinus pressure and sore throat.   Eyes:  Negative for pain and visual disturbance.  Respiratory:  Negative for cough, chest tightness and shortness of breath.   Cardiovascular:  Negative for chest pain, palpitations and leg swelling.  Gastrointestinal:  Negative for abdominal pain, diarrhea, nausea and vomiting.  Genitourinary:  Negative for difficulty urinating and dysuria.  Musculoskeletal:  Negative for joint swelling and myalgias.  Skin:  Negative for color change and rash.  Neurological:  Negative for dizziness and headaches.  Hematological:  Negative for adenopathy. Does not bruise/bleed easily.  Psychiatric/Behavioral:  Negative for agitation and dysphoric mood.        Objective:     BP 118/70   Pulse 68   Temp 98.2 F (36.8 C)   Resp 16   Ht 5\' 3"  (1.6 m)   Wt 181 lb 3.2 oz (82.2 kg)   SpO2 98%   BMI 32.10 kg/m  Wt Readings from Last 3 Encounters:  02/01/23 181 lb 3.2 oz (82.2 kg)  12/11/22 186 lb 6.4 oz (84.6 kg)  12/01/22 187 lb 6.4 oz (85 kg)  Physical Exam Vitals reviewed.  Constitutional:      General: She is not in acute distress.    Appearance: Normal appearance. She is well-developed.  HENT:     Head: Normocephalic and atraumatic.     Right Ear: External ear normal.     Left Ear: External ear normal.  Eyes:     General: No scleral icterus.       Right eye: No discharge.        Left eye: No discharge.     Conjunctiva/sclera: Conjunctivae normal.  Neck:     Thyroid: No thyromegaly.  Cardiovascular:     Rate and Rhythm: Normal rate and regular rhythm.  Pulmonary:     Effort: No tachypnea, accessory muscle usage or respiratory distress.     Breath sounds: Normal breath sounds. No decreased breath sounds or wheezing.  Chest:  Breasts:    Right: No inverted nipple, mass, nipple discharge or tenderness (no axillary adenopathy).     Left: No inverted nipple, mass, nipple discharge or  tenderness (no axilarry adenopathy).  Abdominal:     General: Bowel sounds are normal.     Palpations: Abdomen is soft.     Tenderness: There is no abdominal tenderness.  Musculoskeletal:        General: No swelling or tenderness.     Cervical back: Neck supple.  Lymphadenopathy:     Cervical: No cervical adenopathy.  Skin:    Findings: No erythema or rash.  Neurological:     Mental Status: She is alert and oriented to person, place, and time.  Psychiatric:        Mood and Affect: Mood normal.        Behavior: Behavior normal.      Outpatient Encounter Medications as of 02/01/2023  Medication Sig   albuterol (VENTOLIN HFA) 108 (90 Base) MCG/ACT inhaler Inhale 2 puffs into the lungs every 6 (six) hours as needed for wheezing or shortness of breath.   Ascorbic Acid (VITAMIN C) 1000 MG tablet Take 1,000 mg by mouth daily.   citalopram (CELEXA) 20 MG tablet TAKE 1 TABLET BY MOUTH  DAILY   fluticasone (FLONASE) 50 MCG/ACT nasal spray USE 2 SPRAYS IN BOTH  NOSTRILS DAILY   fluticasone furoate-vilanterol (BREO ELLIPTA) 100-25 MCG/ACT AEPB Inhale 1 puff into the lungs daily.   ibuprofen (ADVIL) 600 MG tablet Take 1 tablet (600 mg total) by mouth every 6 (six) hours as needed.   montelukast (SINGULAIR) 10 MG tablet Take 1 tablet (10 mg total) by mouth at bedtime.   Multiple Vitamin (MULTIVITAMIN) tablet Take 1 tablet by mouth daily.   omeprazole (PRILOSEC) 40 MG capsule TAKE 1 CAPSULE BY MOUTH DAILY   ondansetron (ZOFRAN) 4 MG tablet Take 1 tablet (4 mg total) by mouth 2 (two) times daily as needed for nausea or vomiting.   scopolamine (TRANSDERM-SCOP) 1 MG/3DAYS Place 1 patch (1.5 mg total) onto the skin every 3 (three) days.   Tiotropium Bromide Monohydrate (SPIRIVA RESPIMAT) 2.5 MCG/ACT AERS Inhale 2 puffs into the lungs daily.   tirzepatide (ZEPBOUND) 2.5 MG/0.5ML Pen Inject 2.5 mg into the skin once a week.   [DISCONTINUED] ondansetron (ZOFRAN ODT) 8 MG disintegrating tablet 1/2- 1  tablet q 8 hr prn nausea, vomiting (Patient not taking: Reported on 12/11/2022)   [DISCONTINUED] Semaglutide-Weight Management (WEGOVY) 0.25 MG/0.5ML SOAJ Inject 0.25 mg into the skin once a week.   No facility-administered encounter medications on file as of 02/01/2023.     Lab Results  Component Value Date   WBC 5.9 12/01/2022   HGB 12.3 12/01/2022   HCT 38.5 12/01/2022   PLT 226 12/01/2022   GLUCOSE 98 12/01/2022   CHOL 181 12/01/2022   TRIG 81 12/01/2022   HDL 58 12/01/2022   LDLCALC 108 (H) 12/01/2022   ALT 28 12/01/2022   AST 28 12/01/2022   NA 143 12/01/2022   K 4.8 12/01/2022   CL 106 12/01/2022   CREATININE 0.75 12/01/2022   BUN 18 12/01/2022   CO2 24 12/01/2022   TSH 1.740 12/01/2022   HGBA1C 5.3 03/31/2019    MM 3D SCREEN BREAST BILATERAL  Result Date: 03/09/2022 CLINICAL DATA:  Screening. EXAM: DIGITAL SCREENING BILATERAL MAMMOGRAM WITH TOMOSYNTHESIS AND CAD TECHNIQUE: Bilateral screening digital craniocaudal and mediolateral oblique mammograms were obtained. Bilateral screening digital breast tomosynthesis was performed. The images were evaluated with computer-aided detection. COMPARISON:  Previous exam(s). ACR Breast Density Category b: There are scattered areas of fibroglandular density. FINDINGS: There are no findings suspicious for malignancy. IMPRESSION: No mammographic evidence of malignancy. A result letter of this screening mammogram will be mailed directly to the patient. RECOMMENDATION: Screening mammogram in one year. (Code:SM-B-01Y) BI-RADS CATEGORY  1: Negative. Electronically Signed   By: Sherian Rein M.D.   On: 03/09/2022 10:43       Assessment & Plan:  Routine general medical examination at a health care facility  Health care maintenance Assessment & Plan: Physical today 02/01/23.  PAP 10/09/20 - negative with negative HPV.  colonoscopy 01/2019.    Mild persistent asthma, unspecified whether complicated Assessment & Plan: On Breo now.  Has albuterol  inhaler if needed.  No cough or congestion.  Breathing stable.  Continue singulair.   Crohn's disease of small intestine without complication (HCC) Assessment & Plan: Has seen GI. Documented history of crohn's.  Bowels stable.    Family history of colonic polyps Assessment & Plan: Colonoscopy 01/31/19.  Recommended f/u in 5 years.    Fatty liver disease, nonalcoholic Assessment & Plan: Follow liver panel.  Continue diet and exercise.     Gastroesophageal reflux disease with esophagitis without hemorrhage Assessment & Plan: Doing well on prilosec.  Follow.  S/p EGD and colonoscopy.    Stress Assessment & Plan: On citalopram.  Does not feel needs any further intervention at this time.  Follow.    Thyroid nodule Assessment & Plan: Evaluated by endocrinology (Dr Tedd Sias) 02/24/22 - repeat neck ultrasound - thyroid nodules - stable.  No repeat ultrasound for surveillance recommended.    Weight loss counseling, encounter for Assessment & Plan: Zepbound has been approved.  Diet and exercise.       Dale Oketo, MD

## 2023-02-01 NOTE — Assessment & Plan Note (Signed)
Physical today 02/01/23.  PAP 10/09/20 - negative with negative HPV.  colonoscopy 01/2019.

## 2023-02-07 ENCOUNTER — Encounter: Payer: Self-pay | Admitting: Internal Medicine

## 2023-02-07 NOTE — Assessment & Plan Note (Signed)
Evaluated by endocrinology (Dr Solum) 02/24/22 - repeat neck ultrasound - thyroid nodules - stable.  No repeat ultrasound for surveillance recommended.  

## 2023-02-07 NOTE — Assessment & Plan Note (Signed)
Has seen GI. Documented history of crohn's.  Bowels stable.  °

## 2023-02-07 NOTE — Assessment & Plan Note (Signed)
Doing well on prilosec.  Follow.  S/p EGD and colonoscopy.  

## 2023-02-07 NOTE — Assessment & Plan Note (Signed)
On Breo now.  Has albuterol inhaler if needed.  No cough or congestion.  Breathing stable.  Continue singulair.  °

## 2023-02-07 NOTE — Assessment & Plan Note (Signed)
Colonoscopy 01/31/19.  Recommended f/u in 5 years.  

## 2023-02-07 NOTE — Assessment & Plan Note (Signed)
Follow liver panel.  Continue diet and exercise.   °

## 2023-02-07 NOTE — Assessment & Plan Note (Signed)
On citalopram.  Does not feel needs any further intervention at this time.  Follow.  

## 2023-02-07 NOTE — Assessment & Plan Note (Signed)
Zepbound has been approved.  Diet and exercise.

## 2023-02-11 ENCOUNTER — Ambulatory Visit: Payer: Managed Care, Other (non HMO)

## 2023-02-11 ENCOUNTER — Telehealth: Payer: Self-pay | Admitting: Pulmonary Disease

## 2023-02-11 DIAGNOSIS — G4733 Obstructive sleep apnea (adult) (pediatric): Secondary | ICD-10-CM

## 2023-02-11 DIAGNOSIS — R0683 Snoring: Secondary | ICD-10-CM

## 2023-02-11 NOTE — Telephone Encounter (Signed)
Call patient  Sleep study result  Date of study: 01/19/2023  Impression: Moderate obstructive sleep apnea with mild oxygen desaturations  Recommendation: DME referral  Recommend CPAP therapy for moderate obstructive sleep apnea  Auto titrating CPAP with pressure settings of 5-15 will be appropriate  Encourage weight loss measures  Follow-up in the office 4 to 6 weeks following initiation of treatment

## 2023-02-11 NOTE — Telephone Encounter (Signed)
Will discuss at [facility] 02/12/23.

## 2023-02-12 ENCOUNTER — Encounter: Payer: Self-pay | Admitting: Adult Health

## 2023-02-12 ENCOUNTER — Ambulatory Visit: Payer: Managed Care, Other (non HMO) | Admitting: Adult Health

## 2023-02-12 VITALS — BP 120/80 | HR 69 | Temp 97.3°F | Ht 63.0 in | Wt 181.2 lb

## 2023-02-12 DIAGNOSIS — G4733 Obstructive sleep apnea (adult) (pediatric): Secondary | ICD-10-CM | POA: Diagnosis not present

## 2023-02-12 DIAGNOSIS — J4530 Mild persistent asthma, uncomplicated: Secondary | ICD-10-CM

## 2023-02-12 NOTE — Progress Notes (Signed)
@Patient  ID: Tasha Woods, female    DOB: 1971/04/09, 52 y.o.   MRN: [id number]  Chief Complaint  Patient presents with   Follow-up    Referring provider: Dale Fairchild, MD  HPI: 52 year old female never smoker followed for asthma.  Sleep consult December 11, 2022 for loud snoring found to have moderate obstructive sleep apnea  TEST/EVENTS :  Home sleep study done Jan 19, 2023 showed moderate sleep apnea with AHI at 25.4/hour and SpO2 low at 87%  02/12/2023 Follow up ; OSA and Asthma  Patient returns for a 44-month follow-up.  Patient has history of mild persistent asthma.  Is on Breo.  Says overall her breathing is doing good.  She denies any flare of cough or wheezing.  No increased albuterol use.  She remains on Flonase and Singulair.  Last visit patient was having snoring, restless sleep and daytime sleepiness.  She was set up for home sleep study that was done Jan 19, 2023 that showed moderate sleep apnea with AHI 25/hour and SpO2 low at 87%.  We went over her sleep study results in detail.  Went over treatment options including oral appliance, weight loss and CPAP therapy.  Patient says she is working on weight loss.  She does not want to begin CPAP therapy at this time.  Says she is very claustrophobic.  She is working with her dentist on an oral appliance.  Will to try this first and then see if it helps.       Allergies  Allergen Reactions   Ceftin [Cefuroxime Axetil] Rash   Erythromycin Rash   Penicillins Rash   Sulfa Antibiotics Rash    Immunization History  Administered Date(s) Administered   Hep A / Hep B 10/09/2020, 11/07/2020, 04/09/2021   Influenza Split 07/15/2014   Influenza,inj,Quad PF,6+ Mos 07/07/2018   Influenza,inj,quad, With Preservative 07/14/2019   Influenza-Unspecified 07/01/2020, 06/23/2021   PFIZER(Purple Top)SARS-COV-2 Vaccination 10/29/2019, 11/21/2019, 08/12/2020    Past Medical History:  Diagnosis Date   Allergy    Anxiety    Asthma     GERD (gastroesophageal reflux disease)     Tobacco History: Social History   Tobacco Use  Smoking Status Never  Smokeless Tobacco Never   Counseling given: Not Answered   Outpatient Medications Prior to Visit  Medication Sig Dispense Refill   albuterol (VENTOLIN HFA) 108 (90 Base) MCG/ACT inhaler Inhale 2 puffs into the lungs every 6 (six) hours as needed for wheezing or shortness of breath. 8 g 2   Ascorbic Acid (VITAMIN C) 1000 MG tablet Take 1,000 mg by mouth daily.     citalopram (CELEXA) 20 MG tablet TAKE 1 TABLET BY MOUTH  DAILY 90 tablet 3   fluticasone (FLONASE) 50 MCG/ACT nasal spray USE 2 SPRAYS IN BOTH  NOSTRILS DAILY 48 g 1   fluticasone furoate-vilanterol (BREO ELLIPTA) 100-25 MCG/ACT AEPB Inhale 1 puff into the lungs daily. 28 each 11   ibuprofen (ADVIL) 600 MG tablet Take 1 tablet (600 mg total) by mouth every 6 (six) hours as needed. 30 tablet 0   montelukast (SINGULAIR) 10 MG tablet Take 1 tablet (10 mg total) by mouth at bedtime. 90 tablet 3   Multiple Vitamin (MULTIVITAMIN) tablet Take 1 tablet by mouth daily.     omeprazole (PRILOSEC) 40 MG capsule TAKE 1 CAPSULE BY MOUTH DAILY 90 capsule 3   ondansetron (ZOFRAN) 4 MG tablet Take 1 tablet (4 mg total) by mouth 2 (two) times daily as needed for nausea or vomiting.  14 tablet 0   scopolamine (TRANSDERM-SCOP) 1 MG/3DAYS Place 1 patch (1.5 mg total) onto the skin every 3 (three) days. 10 patch 0   Tiotropium Bromide Monohydrate (SPIRIVA RESPIMAT) 2.5 MCG/ACT AERS Inhale 2 puffs into the lungs daily. 1 each 5   tirzepatide (ZEPBOUND) 2.5 MG/0.5ML Pen Inject 2.5 mg into the skin once a week. 6 mL 0   No facility-administered medications prior to visit.     Review of Systems:   Constitutional:   No  weight loss, night sweats,  Fevers, chills, fatigue, or  lassitude.  HEENT:   No headaches,  Difficulty swallowing,  Tooth/dental problems, or  Sore throat,                No sneezing, itching, ear ache, nasal congestion,  post nasal drip,   CV:  No chest pain,  Orthopnea, PND, swelling in lower extremities, anasarca, dizziness, palpitations, syncope.   GI  No heartburn, indigestion, abdominal pain, nausea, vomiting, diarrhea, change in bowel habits, loss of appetite, bloody stools.   Resp: No shortness of breath with exertion or at rest.  No excess mucus, no productive cough,  No non-productive cough,  No coughing up of blood.  No change in color of mucus.  No wheezing.  No chest wall deformity  Skin: no rash or lesions.  GU: no dysuria, change in color of urine, no urgency or frequency.  No flank pain, no hematuria   MS:  No joint pain or swelling.  No decreased range of motion.  No back pain.    Physical Exam  BP 120/80 (BP Location: Left Arm, Cuff Size: Normal)   Pulse 69   Temp (!) 97.3 F (36.3 C)   Ht 5\' 3"  (1.6 m)   Wt 181 lb 3.2 oz (82.2 kg)   SpO2 98%   BMI 32.10 kg/m   GEN: A/Ox3; pleasant , NAD, well nourished    HEENT:  [place]/AT,  NOSE-clear, THROAT-clear, no lesions, no postnasal drip or exudate noted.  Class III MP airway  NECK:  Supple w/ fair ROM; no JVD; normal carotid impulses w/o bruits; no thyromegaly or nodules palpated; no lymphadenopathy.    RESP  Clear  P & A; w/o, wheezes/ rales/ or rhonchi. no accessory muscle use, no dullness to percussion  CARD:  RRR, no m/r/g, no peripheral edema, pulses intact, no cyanosis or clubbing.  GI:   Soft & nt; nml bowel sounds; no organomegaly or masses detected.   Musco: Warm bil, no deformities or joint swelling noted.   Neuro: alert, no focal deficits noted.    Skin: Warm, no lesions or rashes    Lab Results:  CBC Discontinue    ProBNP No results found for: "PROBNP"  Imaging: No results found.        No data to display          No results found for: "NITRICOXIDE"      Assessment & Plan:   Asthma Mild persistent asthma with excellent control on current regimen.  No changes  Plan  Patient  Instructions  Asthma action plan as discussed.  Continue on BREO 1 puff daily , rinse after use.  Continue Singulair daily Albuterol inhaler as needed  Follow up with Dentist for snore guard.  Healthy sleep regimen Do not if sleepy Follow-up in 6 months with Dr. Jayme Cloud or Jacqeline Broers NP     OSA (obstructive sleep apnea) Moderate obstructive sleep apnea.  Patient education was given on sleep apnea.  We  discussed her sleep study results in detail.  Went over potential complications of untreated sleep apnea.  We discussed starting on CPAP therapy however patient declines at this time wants to try oral appliance.  She is working with her dentist on this locally.  Patient is advised to wear this each night.  Will have her return and going forward at some point check a repeat home sleep study.  Plan  Patient Instructions  Asthma action plan as discussed.  Continue on BREO 1 puff daily , rinse after use.  Continue Singulair daily Albuterol inhaler as needed  Follow up with Dentist for snore guard.  Healthy sleep regimen Do not if sleepy Follow-up in 6 months with Dr. Jayme Cloud or Ryett Hamman NP       Rubye Oaks, NP 02/12/2023

## 2023-02-12 NOTE — Patient Instructions (Addendum)
Asthma action plan as discussed.  Continue on BREO 1 puff daily , rinse after use.  Continue Singulair daily Albuterol inhaler as needed  Follow up with Dentist for snore guard.  Healthy sleep regimen Do not if sleepy Follow-up in 6 months with Dr. Jayme Cloud or Tayshun Gappa NP

## 2023-02-12 NOTE — Assessment & Plan Note (Signed)
Moderate obstructive sleep apnea.  Patient education was given on sleep apnea.  We discussed her sleep study results in detail.  Went over potential complications of untreated sleep apnea.  We discussed starting on CPAP therapy however patient declines at this time wants to try oral appliance.  She is working with her dentist on this locally.  Patient is advised to wear this each night.  Will have her return and going forward at some point check a repeat home sleep study.  Plan  Patient Instructions  Asthma action plan as discussed.  Continue on BREO 1 puff daily , rinse after use.  Continue Singulair daily Albuterol inhaler as needed  Follow up with Dentist for snore guard.  Healthy sleep regimen Do not if sleepy Follow-up in 6 months with Dr. Jayme Cloud or Fronnie Urton NP

## 2023-02-12 NOTE — Assessment & Plan Note (Signed)
Mild persistent asthma with excellent control on current regimen.  No changes  Plan  Patient Instructions  Asthma action plan as discussed.  Continue on BREO 1 puff daily , rinse after use.  Continue Singulair daily Albuterol inhaler as needed  Follow up with Dentist for snore guard.  Healthy sleep regimen Do not if sleepy Follow-up in 6 months with Dr. Jayme Cloud or Saydee Zolman NP

## 2023-02-12 NOTE — Progress Notes (Signed)
Agree with the details of the visit as noted by Tammy Parrett, NP.  C. Laura Derenda Giddings, MD  PCCM 

## 2023-02-15 DIAGNOSIS — G4733 Obstructive sleep apnea (adult) (pediatric): Secondary | ICD-10-CM

## 2023-02-17 ENCOUNTER — Encounter: Payer: Self-pay | Admitting: Internal Medicine

## 2023-02-17 ENCOUNTER — Other Ambulatory Visit: Payer: Self-pay

## 2023-02-17 MED ORDER — FLUTICASONE PROPIONATE 50 MCG/ACT NA SUSP: NASAL | 1 refills | Status: DC

## 2023-02-17 MED ORDER — CITALOPRAM HYDROBROMIDE 20 MG PO TABS: ORAL_TABLET | ORAL | 3 refills | Status: DC

## 2023-02-17 MED ORDER — MONTELUKAST SODIUM 10 MG PO TABS: 10.0000 mg | ORAL_TABLET | Freq: Every day | ORAL | 3 refills | Status: DC

## 2023-02-22 ENCOUNTER — Telehealth: Payer: Self-pay | Admitting: Adult Health

## 2023-02-22 NOTE — Telephone Encounter (Signed)
Referral placed 02/15/2023.  Synetta Fail, can you assist with this?

## 2023-02-22 NOTE — Telephone Encounter (Signed)
Tresa Endo states needs referral for dental appliance. [facility] phone number is [phone]. Fax number is [phone].

## 2023-02-22 NOTE — Telephone Encounter (Signed)
I have faxed the referral order to [facility] Dr. Donnamarie Poag which is different from [facility] office

## 2023-02-24 ENCOUNTER — Telehealth: Payer: Self-pay | Admitting: Adult Health

## 2023-02-24 NOTE — Telephone Encounter (Signed)
See last signed encounter for more clarity.

## 2023-02-24 NOTE — Telephone Encounter (Signed)
Dr. Dois [facility] Office Calling.  Was this referral Ms. Parrett sent supposed to go to [facility] dentist office  or [facility]. This dental office is in [place]. Please we call    Morrie Sheldon [phone]  To advise. TY

## 2023-02-24 NOTE — Telephone Encounter (Signed)
The order was suppose to have been sent to [facility] and the referral has now been sent to the correct location

## 2023-03-02 NOTE — Telephone Encounter (Signed)
Are you okay with increasing her dose of zepbound to the 5 mg?

## 2023-03-02 NOTE — Telephone Encounter (Signed)
If she is tolerating and having no problems taking, ok to send in 5mg  zepbound

## 2023-03-03 MED ORDER — ZEPBOUND 5 MG/0.5ML ~~LOC~~ SOAJ: 5.0000 mg | SUBCUTANEOUS | 1 refills | Status: DC

## 2023-04-26 ENCOUNTER — Encounter: Payer: Self-pay | Admitting: Internal Medicine

## 2023-04-27 NOTE — Telephone Encounter (Signed)
See phone note in moms chart.

## 2023-04-27 NOTE — Telephone Encounter (Signed)
See phone message

## 2023-05-06 DIAGNOSIS — G4733 Obstructive sleep apnea (adult) (pediatric): Secondary | ICD-10-CM

## 2023-05-06 MED ORDER — AMBULATORY NON FORMULARY MEDICATION: 0 refills | Status: AC

## 2023-05-06 NOTE — Telephone Encounter (Signed)
Script has been printed and faxed to you.

## 2023-05-06 NOTE — Telephone Encounter (Signed)
Signed.

## 2023-05-10 NOTE — Telephone Encounter (Deleted)
Herbert Seta do you have the signed script? Can you fax it to me so we can get it sent in? Or I can get you the fax number to send it to.

## 2023-05-10 NOTE — Telephone Encounter (Signed)
Script has been faxed to [facility].  Nothing further needed.

## 2023-05-31 ENCOUNTER — Other Ambulatory Visit: Payer: Self-pay | Admitting: Internal Medicine

## 2023-06-03 ENCOUNTER — Ambulatory Visit: Payer: Managed Care, Other (non HMO) | Admitting: Internal Medicine

## 2023-06-03 VITALS — BP 128/70 | HR 74 | Temp 98.0°F | Resp 16 | Ht 63.0 in | Wt 171.4 lb

## 2023-06-03 DIAGNOSIS — K2100 Gastro-esophageal reflux disease with esophagitis, without bleeding: Secondary | ICD-10-CM

## 2023-06-03 DIAGNOSIS — K5000 Crohn's disease of small intestine without complications: Secondary | ICD-10-CM

## 2023-06-03 DIAGNOSIS — K760 Fatty (change of) liver, not elsewhere classified: Secondary | ICD-10-CM | POA: Diagnosis not present

## 2023-06-03 DIAGNOSIS — F439 Reaction to severe stress, unspecified: Secondary | ICD-10-CM

## 2023-06-03 DIAGNOSIS — Z83719 Family history of colon polyps, unspecified: Secondary | ICD-10-CM

## 2023-06-03 DIAGNOSIS — G4733 Obstructive sleep apnea (adult) (pediatric): Secondary | ICD-10-CM | POA: Diagnosis not present

## 2023-06-03 DIAGNOSIS — J4530 Mild persistent asthma, uncomplicated: Secondary | ICD-10-CM | POA: Diagnosis not present

## 2023-06-03 DIAGNOSIS — Z23 Encounter for immunization: Secondary | ICD-10-CM

## 2023-06-03 DIAGNOSIS — E041 Nontoxic single thyroid nodule: Secondary | ICD-10-CM

## 2023-06-03 MED ORDER — CITALOPRAM HYDROBROMIDE 20 MG PO TABS: ORAL_TABLET | ORAL | 1 refills | Status: DC

## 2023-06-03 NOTE — Progress Notes (Signed)
Subjective:    Patient ID: Tasha Woods, female    DOB: [birth year], 52 y.o.   MRN: [id number]  Patient here for  Chief Complaint  Patient presents with   Medical Management of Chronic Issues    HPI Here for f/u - increased stress. Increased stress with work.  Some family stress as well.  Discussed.  She recently increased her citalopram to 30mg  q day.  Issues with sleep.  Recently evaluated by pulmonary.  Seeing dentist - oral device.  Tries to stay active.  No chest pain or sob reported.  No increased cough or congestion.  No abdominal pain reported.  Some loose stool at times.  Taking zepbound.  Tolerating.  Maintaining weight.     Past Medical History:  Diagnosis Date   Allergy    Anxiety    Asthma    GERD (gastroesophageal reflux disease)    Past Surgical History:  Procedure Laterality Date   CHOLECYSTECTOMY  06/08/14   COLONOSCOPY  04-30-14   Dr Lynden Oxford EAR SURGERY     age 75   UPPER GI ENDOSCOPY  04-30-14   Dr Mechele Collin   Family History  Problem Relation Age of Onset   Colon polyps Mother    Hypertension Father    Asthma Father    Cancer Father    Breast cancer Neg Hx    Colon cancer Neg Hx    Social History   Socioeconomic History   Marital status: Married    Spouse name: Not on file   Number of children: 2   Years of education: Not on file   Highest education level: Associate degree: occupational, Scientist, product/process development, or vocational program  Occupational History    Employer: [employer]  Tobacco Use   Smoking status: Never   Smokeless tobacco: Never  Vaping Use   Vaping status: Never Used  Substance and Sexual Activity   Alcohol use: Yes    Alcohol/week: 0.0 standard drinks of alcohol    Comment: occasionally   Drug use: No   Sexual activity: Yes  Other Topics Concern   Not on file  Social History Narrative   Not on file   Social Determinants of Health   Financial Resource Strain: Low Risk  (06/02/2023)   Overall Financial Resource Strain (CARDIA)     Difficulty of Paying Living Expenses: Not hard at all  Food Insecurity: No Food Insecurity (06/02/2023)   Hunger Vital Sign    Worried About Running Out of Food in the Last Year: Never true    Ran Out of Food in the Last Year: Never true  Transportation Needs: No Transportation Needs (06/02/2023)   PRAPARE - Administrator, Civil Service (Medical): No    Lack of Transportation (Non-Medical): No  Physical Activity: Insufficiently Active (06/02/2023)   Exercise Vital Sign    Days of Exercise per Week: 1 day    Minutes of Exercise per Session: 20 min  Stress: Stress Concern Present (06/02/2023)   [employer] of Occupational Health - Occupational Stress Questionnaire    Feeling of Stress : To some extent  Social Connections: Moderately Integrated (06/02/2023)   Social Connection and Isolation Panel [NHANES]    Frequency of Communication with Friends and Family: More than three times a week    Frequency of Social Gatherings with Friends and Family: Twice a week    Attends Religious Services: More than 4 times per year    Active Member of Clubs or Organizations: No  Attends Banker Meetings: Not on file    Marital Status: Married     Review of Systems  Constitutional:  Negative for appetite change and unexpected weight change.  HENT:  Negative for congestion and sinus pressure.   Respiratory:  Negative for cough, chest tightness and shortness of breath.   Cardiovascular:  Negative for chest pain and palpitations.  Gastrointestinal:  Negative for abdominal pain, diarrhea, nausea and vomiting.  Genitourinary:  Negative for difficulty urinating and dysuria.  Musculoskeletal:  Negative for joint swelling and myalgias.  Skin:  Negative for color change and rash.  Neurological:  Negative for dizziness and headaches.  Psychiatric/Behavioral:  Negative for agitation and dysphoric mood.        Increased stress as outlined.        Objective:     BP 128/70    Pulse 74   Temp 98 F (36.7 C)   Resp 16   Ht 5\' 3"  (1.6 m)   Wt 171 lb 6.4 oz (77.7 kg)   SpO2 99%   BMI 30.36 kg/m  Wt Readings from Last 3 Encounters:  06/03/23 171 lb 6.4 oz (77.7 kg)  02/12/23 181 lb 3.2 oz (82.2 kg)  02/01/23 181 lb 3.2 oz (82.2 kg)    Physical Exam Vitals reviewed.  Constitutional:      General: She is not in acute distress.    Appearance: Normal appearance.  HENT:     Head: Normocephalic and atraumatic.     Right Ear: External ear normal.     Left Ear: External ear normal.  Eyes:     General: No scleral icterus.       Right eye: No discharge.        Left eye: No discharge.     Conjunctiva/sclera: Conjunctivae normal.  Neck:     Thyroid: No thyromegaly.  Cardiovascular:     Rate and Rhythm: Normal rate and regular rhythm.  Pulmonary:     Effort: No respiratory distress.     Breath sounds: Normal breath sounds. No wheezing.  Abdominal:     General: Bowel sounds are normal.     Palpations: Abdomen is soft.     Tenderness: There is no abdominal tenderness.  Musculoskeletal:        General: No swelling or tenderness.     Cervical back: Neck supple. No tenderness.  Lymphadenopathy:     Cervical: No cervical adenopathy.  Skin:    Findings: No erythema or rash.  Neurological:     Mental Status: She is alert.  Psychiatric:        Mood and Affect: Mood normal.        Behavior: Behavior normal.      Outpatient Encounter Medications as of 06/03/2023  Medication Sig   albuterol (VENTOLIN HFA) 108 (90 Base) MCG/ACT inhaler Inhale 2 puffs into the lungs every 6 (six) hours as needed for wheezing or shortness of breath.   AMBULATORY NON FORMULARY MEDICATION Mandibular Advancement Device  Use as directed   Ascorbic Acid (VITAMIN C) 1000 MG tablet Take 1,000 mg by mouth daily.   citalopram (CELEXA) 20 MG tablet TAKE 1 and 1/2 TABLET BY MOUTH  DAILY   fluticasone (FLONASE) 50 MCG/ACT nasal spray USE 2 SPRAYS IN BOTH  NOSTRILS DAILY   fluticasone  furoate-vilanterol (BREO ELLIPTA) 100-25 MCG/ACT AEPB Inhale 1 puff into the lungs daily.   ibuprofen (ADVIL) 600 MG tablet Take 1 tablet (600 mg total) by mouth every 6 (six) hours as  needed.   montelukast (SINGULAIR) 10 MG tablet Take 1 tablet (10 mg total) by mouth at bedtime.   Multiple Vitamin (MULTIVITAMIN) tablet Take 1 tablet by mouth daily.   omeprazole (PRILOSEC) 40 MG capsule TAKE 1 CAPSULE BY MOUTH DAILY   ondansetron (ZOFRAN) 4 MG tablet Take 1 tablet (4 mg total) by mouth 2 (two) times daily as needed for nausea or vomiting.   scopolamine (TRANSDERM-SCOP) 1 MG/3DAYS Place 1 patch (1.5 mg total) onto the skin every 3 (three) days.   Tiotropium Bromide Monohydrate (SPIRIVA RESPIMAT) 2.5 MCG/ACT AERS Inhale 2 puffs into the lungs daily.   ZEPBOUND 5 MG/0.5ML Pen ADMINISTER 5 MG UNDER THE SKIN 1 TIME A WEEK   [DISCONTINUED] citalopram (CELEXA) 20 MG tablet TAKE 1 TABLET BY MOUTH  DAILY   No facility-administered encounter medications on file as of 06/03/2023.     Lab Results  Component Value Date   WBC 5.9 12/01/2022   HGB 12.3 12/01/2022   HCT 38.5 12/01/2022   PLT 226 12/01/2022   GLUCOSE 98 12/01/2022   CHOL 181 12/01/2022   TRIG 81 12/01/2022   HDL 58 12/01/2022   LDLCALC 108 (H) 12/01/2022   ALT 28 12/01/2022   AST 28 12/01/2022   NA 143 12/01/2022   K 4.8 12/01/2022   CL 106 12/01/2022   CREATININE 0.75 12/01/2022   BUN 18 12/01/2022   CO2 24 12/01/2022   TSH 1.740 12/01/2022   HGBA1C 5.3 03/31/2019    MM 3D SCREEN BREAST BILATERAL  Result Date: 03/09/2022 CLINICAL DATA:  Screening. EXAM: DIGITAL SCREENING BILATERAL MAMMOGRAM WITH TOMOSYNTHESIS AND CAD TECHNIQUE: Bilateral screening digital craniocaudal and mediolateral oblique mammograms were obtained. Bilateral screening digital breast tomosynthesis was performed. The images were evaluated with computer-aided detection. COMPARISON:  Previous exam(s). ACR Breast Density Category b: There are scattered areas of  fibroglandular density. FINDINGS: There are no findings suspicious for malignancy. IMPRESSION: No mammographic evidence of malignancy. A result letter of this screening mammogram will be mailed directly to the patient. RECOMMENDATION: Screening mammogram in one year. (Code:SM-B-01Y) BI-RADS CATEGORY  1: Negative. Electronically Signed   By: Sherian Rein M.D.   On: 03/09/2022 10:43       Assessment & Plan:  Need for influenza vaccination -     Flu vaccine trivalent PF, 6mos and older(Flulaval,Afluria,Fluarix,Fluzone)  Mild persistent asthma, unspecified whether complicated Assessment & Plan: On Breo now.  Has albuterol inhaler if needed.  No cough or congestion.  Breathing stable.  Continue singulair.   Crohn's disease of small intestine without complication (HCC) Assessment & Plan: Has seen GI. Documented history of crohn's.  Bowels overall relatively stable.    Family history of colonic polyps Assessment & Plan: Colonoscopy 01/31/19.  Recommended f/u in 5 years.    Fatty liver disease, nonalcoholic Assessment & Plan: Follow liver panel.  Continue diet and exercise.  Has lost weight.    OSA (obstructive sleep apnea) Assessment & Plan: Follow up pulmonary 01/2023- wants to hold on cpap.  Seeing dentist - oral appliance.  Has lost weight.     Gastroesophageal reflux disease with esophagitis without hemorrhage Assessment & Plan: Doing well on prilosec.  Follow.  S/p EGD and colonoscopy.    Stress Assessment & Plan: On citalopram. She just increased her dose to 30mg  q day.  Does not feel needs any further intervention at this time.  Follow.    Thyroid nodule Assessment & Plan: Evaluated by endocrinology (Dr Tedd Sias) 02/24/22 - repeat neck ultrasound - thyroid  nodules - stable.  No repeat ultrasound for surveillance recommended.    Other orders -     Citalopram Hydrobromide; TAKE 1 and 1/2 TABLET BY MOUTH  DAILY  Dispense: 135 tablet; Refill: 1     Dale Burns, MD

## 2023-06-06 ENCOUNTER — Encounter: Payer: Self-pay | Admitting: Internal Medicine

## 2023-06-06 NOTE — Assessment & Plan Note (Signed)
Evaluated by endocrinology (Dr Tedd Sias) 02/24/22 - repeat neck ultrasound - thyroid nodules - stable.  No repeat ultrasound for surveillance recommended.

## 2023-06-06 NOTE — Assessment & Plan Note (Signed)
On Breo now.  Has albuterol inhaler if needed.  No cough or congestion.  Breathing stable.  Continue singulair.

## 2023-06-06 NOTE — Assessment & Plan Note (Signed)
Follow up pulmonary 01/2023- wants to hold on cpap.  Seeing dentist - oral appliance.  Has lost weight.

## 2023-06-06 NOTE — Assessment & Plan Note (Addendum)
Follow liver panel.  Continue diet and exercise.  Has lost weight.

## 2023-06-06 NOTE — Assessment & Plan Note (Signed)
Doing well on prilosec.  Follow.  S/p EGD and colonoscopy.

## 2023-06-06 NOTE — Assessment & Plan Note (Addendum)
Has seen GI. Documented history of crohn's.  Bowels overall relatively stable.

## 2023-06-06 NOTE — Assessment & Plan Note (Signed)
Colonoscopy 01/31/19.  Recommended f/u in 5 years.  

## 2023-06-06 NOTE — Assessment & Plan Note (Signed)
On citalopram. She just increased her dose to 30mg  q day.  Does not feel needs any further intervention at this time.  Follow.

## 2023-06-07 ENCOUNTER — Ambulatory Visit
Admission: RE | Admit: 2023-06-07 | Discharge: 2023-06-07 | Disposition: A | Discharge status: Home / Self Care | Payer: Managed Care, Other (non HMO) | Source: Ambulatory Visit | Attending: Internal Medicine | Admitting: Internal Medicine

## 2023-06-07 DIAGNOSIS — Z1231 Encounter for screening mammogram for malignant neoplasm of breast: Secondary | ICD-10-CM | POA: Insufficient documentation

## 2023-07-08 DIAGNOSIS — G4733 Obstructive sleep apnea (adult) (pediatric): Secondary | ICD-10-CM

## 2023-07-08 NOTE — Telephone Encounter (Signed)
That is fine , Home sleep study (on oral appliance)

## 2023-07-22 DIAGNOSIS — G4733 Obstructive sleep apnea (adult) (pediatric): Secondary | ICD-10-CM

## 2023-07-28 ENCOUNTER — Telehealth: Payer: Self-pay | Admitting: Adult Health

## 2023-07-28 NOTE — Telephone Encounter (Signed)
PT states in order for the ins co to pay for her mouth devise she needs a physical written RX mailed to her on a script pad w/Dr.s signature. I verified her address as correct.

## 2023-08-03 ENCOUNTER — Telehealth: Payer: Self-pay | Admitting: Pulmonary Disease

## 2023-08-03 NOTE — Telephone Encounter (Signed)
Call patient  Sleep study result  Date of study: 07/22/2023  Impression: Mild obstructive sleep apnea with mild oxygen desaturations  It is assumed that current study was performed with an oral device in place  Recommendation:  Compared with previous study AHI is 9.3 as compared with 25.4 on previous study  Continue to optimize current treatment  Clinical follow-up as scheduled

## 2023-08-04 DIAGNOSIS — G4733 Obstructive sleep apnea (adult) (pediatric): Secondary | ICD-10-CM

## 2023-08-05 NOTE — Telephone Encounter (Signed)
Called and spoke with patient, advised of results/recommendations per Tammy Parrett NP.  She verbalized understanding.  Nothing further needed.

## 2023-08-05 NOTE — Telephone Encounter (Signed)
Yes please let patient know sleep apnea has improved is down to mild level (went from 25 events/hr to 9.3/hr  Continue on Oral appliance at bedtime.

## 2023-08-05 NOTE — Telephone Encounter (Signed)
Spoke with patient, she is needing a prescription for her oral device for sleep apnea for her insurance to cover the cost.  Prescription printed out and faxed to the [place] office at [phone].  Called patient and advised her that I faxed the prescription to the [facility] office and she can go pick it up.  She verbalized understanding.  Nothing further needed. Marland Kitchen

## 2023-08-11 NOTE — Telephone Encounter (Signed)
NFN 

## 2023-08-19 ENCOUNTER — Encounter: Payer: Self-pay | Admitting: Internal Medicine

## 2023-08-19 ENCOUNTER — Other Ambulatory Visit: Payer: Self-pay

## 2023-08-19 MED ORDER — ZEPBOUND 5 MG/0.5ML ~~LOC~~ SOAJ
5.0000 mg | SUBCUTANEOUS | 1 refills | Status: DC
Start: 2023-08-19 — End: 2023-12-03

## 2023-09-02 ENCOUNTER — Encounter: Payer: Self-pay | Admitting: Internal Medicine

## 2023-09-02 NOTE — Telephone Encounter (Signed)
 I am happy to see Tasha Woods.

## 2023-09-02 NOTE — Telephone Encounter (Signed)
 Ok for her husband to re-establish?

## 2023-09-03 ENCOUNTER — Other Ambulatory Visit (HOSPITAL_COMMUNITY): Payer: Self-pay

## 2023-09-03 NOTE — Telephone Encounter (Signed)
 Her husband has been scheduled. Please mail new patient packet to [address]  [place] [address]

## 2023-09-10 ENCOUNTER — Telehealth: Payer: Self-pay

## 2023-09-10 ENCOUNTER — Other Ambulatory Visit (HOSPITAL_COMMUNITY): Payer: Self-pay

## 2023-09-10 NOTE — Telephone Encounter (Signed)
*  Primary  Pharmacy Patient Advocate Encounter   Received notification from CoverMyMeds that prior authorization for Zepbound  5MG /0.5ML pen-injectors  is required/requested.   Insurance verification completed.   The patient is insured through [facility] .   Per test claim: PA required; PA submitted to above mentioned insurance via CoverMyMeds Key/confirmation #/EOC [id number] Status is pending

## 2023-09-14 ENCOUNTER — Other Ambulatory Visit (HOSPITAL_COMMUNITY): Payer: Self-pay

## 2023-09-14 NOTE — Telephone Encounter (Signed)
 Pharmacy Patient Advocate Encounter  Received notification from [facility] that Prior Authorization for Zepbound 5mg /0.11ml has been APPROVED from 09/10/2023 to 03/09/2024

## 2023-10-25 ENCOUNTER — Other Ambulatory Visit
Admission: RE | Admit: 2023-10-25 | Discharge: 2023-10-25 | Disposition: A | Discharge status: Home / Self Care | Payer: Managed Care, Other (non HMO) | Source: Ambulatory Visit | Attending: Internal Medicine | Admitting: Internal Medicine

## 2023-10-25 ENCOUNTER — Encounter: Payer: Self-pay | Admitting: Internal Medicine

## 2023-10-25 ENCOUNTER — Ambulatory Visit: Payer: Managed Care, Other (non HMO) | Admitting: Internal Medicine

## 2023-10-25 VITALS — BP 122/78 | HR 85 | Temp 97.3°F | Ht 63.0 in | Wt 167.6 lb

## 2023-10-25 DIAGNOSIS — R197 Diarrhea, unspecified: Secondary | ICD-10-CM | POA: Insufficient documentation

## 2023-10-25 LAB — COMPREHENSIVE METABOLIC PANEL
ALT: 15 U/L (ref 0–35)
AST: 21 U/L (ref 0–37)
Albumin: 4.9 g/dL (ref 3.5–5.2)
Alkaline Phosphatase: 78 U/L (ref 39–117)
BUN: 16 mg/dL (ref 6–23)
CO2: 25 meq/L (ref 19–32)
Calcium: 9.6 mg/dL (ref 8.4–10.5)
Chloride: 106 meq/L (ref 96–112)
Creatinine, Ser: 0.75 mg/dL (ref 0.40–1.20)
GFR: 90.4 mL/min (ref >60.00)
Glucose, Bld: 103 mg/dL — ABNORMAL HIGH (ref 70–99)
Potassium: 3.8 meq/L (ref 3.5–5.1)
Sodium: 140 meq/L (ref 135–145)
Total Bilirubin: 0.7 mg/dL (ref 0.2–1.2)
Total Protein: 7.8 g/dL (ref 6.0–8.3)

## 2023-10-25 LAB — GASTROINTESTINAL PANEL BY PCR, STOOL (REPLACES STOOL CULTURE)

## 2023-10-25 NOTE — Progress Notes (Signed)
 Acute Office Visit  Subjective:     Patient ID: Tasha Woods, female    DOB: 1971-02-20, 53 y.o.   MRN: [id number]  Chief Complaint  Patient presents with   Acute Visit    Diarrhea & stomach cramping x 8 days  Taking Immodium, Pedialyte and Probiotics- not staying down    HPI Patient is in today for acute diarrhea.  Patient does have a history of chronic diarrhea and Crohn's disease but her Crohn's has been inactive and her chronic diarrhea has improved over the last few years.  Patient states that approximately 8 days ago she developed new onset diarrhea with associated nausea and abdominal cramping.  Patient symptoms were initially mild and had resolved last Thursday (2/20).  However, on Saturday (2/22) she developed worsening diarrhea and abdominal cramping.  Patient states that diarrhea is mainly watery and brown in color.  No hematochezia or melena.  Patient states that she is able to take things orally but feels like they go "straight through her".  She has been taking Pedialyte and probiotics at home.  No fevers or chills.  No vomiting.  Patient states that she took 1 Imodium yesterday which decreased her frequency but did not resolve her diarrhea.  She states that she occasionally feels lightheaded but no lightheadedness currently.  Patient states that she did have [place] food outside of the house about a day or 2 before the onset of her symptoms  Review of Systems  Constitutional: Negative.   Respiratory: Negative.    Cardiovascular: Negative.   Gastrointestinal:  Positive for abdominal pain, diarrhea and nausea. Negative for vomiting.  Musculoskeletal:  Negative for myalgias.  Neurological:  Negative for dizziness and focal weakness.        Objective:    BP 122/78   Pulse 85   Temp (!) 97.3 F (36.3 C)   Ht 5\' 3"  (1.6 m)   Wt 167 lb 9.6 oz (76 kg)   SpO2 97%   BMI 29.69 kg/m    Physical Exam Constitutional:      Appearance: She is ill-appearing.  HENT:      Head: Normocephalic and atraumatic.  Cardiovascular:     Rate and Rhythm: Normal rate and regular rhythm.     Heart sounds: Normal heart sounds.  Pulmonary:     Effort: Pulmonary effort is normal. No respiratory distress.     Breath sounds: No wheezing or rales.  Abdominal:     General: Abdomen is flat. Bowel sounds are normal.     Palpations: Abdomen is soft.     Tenderness: There is abdominal tenderness. There is no guarding or rebound.     Comments: Mild tenderness noted on palpation in the epigastric and periumbilical area.  No guarding or rigidity or rebound tenderness noted  Neurological:     Mental Status: She is alert.  Psychiatric:        Mood and Affect: Mood normal.        Behavior: Behavior normal.     No results found for any visits on 10/25/23.      Assessment & Plan:   Problem List Items Addressed This Visit       Digestive   Acute diarrhea - Primary   -Patient presented today with new onset diarrhea beginning 8 days ago. -It did temporarily resolve last week but then recurred with worsening abdominal cramping and increased frequency of diarrhea -No hematochezia or melena noted -No fevers or chills noted -Patient is able to  tolerate oral intake but states it goes "straight through her" -I suspect that her diarrhea is likely secondary to an acute viral illness. -Will check CMP  -Will check stool pathogen panel  -Will also check fecal calprotectin given that she has a history of possible IBS.  This will help distinguish whether this is an inflammatory condition versus IBS -Patient encouraged to stay hydrated.  Orthostatics were negative in the clinic -Patient advised to go to the ED if she is unable to tolerate oral intake or if she starts having lightheadedness on standing as she may need IV fluids for hydration      Relevant Orders   Comp Met (CMET)   GI pathogen panel by PCR, stool   Calprotectin, Fecal    No orders of the defined types were placed in  this encounter.   No follow-ups on file.  Earl Lagos, MD

## 2023-10-25 NOTE — Patient Instructions (Signed)
-  It was a pleasure meeting you today -I suspect that your diarrhea is likely secondary to an underlying viral infection -Please continue to take Pedialyte and keep yourself well-hydrated -Try and eat softer/bland foods -We will check some blood work today including your liver function and kidney function -We will also check a stool study to see if there are any bacterial or viral organisms that are causing your diarrhea -If you start feeling lightheaded or dizzy you may be depleted and require IV fluids.  Consider following with the ED if this happens -Please contact us if you have any questions or concerns or if your symptoms are persisting

## 2023-10-25 NOTE — Assessment & Plan Note (Signed)
-  Patient presented today with new onset diarrhea beginning 8 days ago. -It did temporarily resolve last week but then recurred with worsening abdominal cramping and increased frequency of diarrhea -No hematochezia or melena noted -No fevers or chills noted -Patient is able to tolerate oral intake but states it goes "straight through her" -I suspect that her diarrhea is likely secondary to an acute viral illness. -Will check CMP  -Will check stool pathogen panel  -Will also check fecal calprotectin given that she has a history of possible IBS.  This will help distinguish whether this is an inflammatory condition versus IBS -Patient encouraged to stay hydrated.  Orthostatics were negative in the clinic -Patient advised to go to the ED if she is unable to tolerate oral intake or if she starts having lightheadedness on standing as she may need IV fluids for hydration

## 2023-10-25 NOTE — Telephone Encounter (Signed)
 Patient will see Dr. Heide Spark at 11:00 today.

## 2023-10-27 ENCOUNTER — Telehealth: Payer: Self-pay | Admitting: Internal Medicine

## 2023-10-27 LAB — CALPROTECTIN, FECAL: Calprotectin, Fecal: 53 ug/g (ref 0–120)

## 2023-10-27 NOTE — Telephone Encounter (Signed)
 I called the patient to talk to her about her results.  She states that her diarrhea has resolved but she still has some abdominal cramping after eating.  Her GI pathogen panel was negative.  Her BMI was within normal limits except for mildly elevated blood glucose level of 103.  Her fecal calprotectin was borderline.  Given that her symptoms are improving I suspect that this is likely a viral gastroenteritis.  However, if the patient gets recurrent symptoms would consider referral to GI given her history of Crohn's disease.  Patient expressed understanding and is in agreement with plan.

## 2023-11-10 ENCOUNTER — Other Ambulatory Visit: Payer: Self-pay | Admitting: Internal Medicine

## 2023-11-18 ENCOUNTER — Encounter: Payer: Self-pay | Admitting: Internal Medicine

## 2023-11-18 DIAGNOSIS — R197 Diarrhea, unspecified: Secondary | ICD-10-CM

## 2023-11-18 NOTE — Telephone Encounter (Signed)
 Order placed for referral to GI. If any problems, let us know.

## 2023-11-18 NOTE — Telephone Encounter (Signed)
Pt declining appt

## 2023-11-18 NOTE — Telephone Encounter (Signed)
 Pt requesting referral. Also notified pt to schedule appt for re-evaluation.

## 2023-11-21 ENCOUNTER — Other Ambulatory Visit: Payer: Self-pay | Admitting: Internal Medicine

## 2023-12-02 ENCOUNTER — Other Ambulatory Visit: Payer: Self-pay | Admitting: Internal Medicine

## 2023-12-03 NOTE — Telephone Encounter (Signed)
 I have refilled zepbound. Needs f/u appt with me. No appt scheduled.

## 2023-12-21 ENCOUNTER — Other Ambulatory Visit: Payer: Self-pay | Admitting: Internal Medicine

## 2023-12-29 ENCOUNTER — Other Ambulatory Visit: Payer: Self-pay | Admitting: Internal Medicine

## 2024-01-14 ENCOUNTER — Encounter: Payer: Self-pay | Admitting: Internal Medicine

## 2024-01-14 MED ORDER — ZEPBOUND 2.5 MG/0.5ML ~~LOC~~ SOAJ: 2.5000 mg | SUBCUTANEOUS | 2 refills | Status: AC

## 2024-01-14 NOTE — Telephone Encounter (Signed)
 I am ok to start 2.5mg  dose, but she needs to let us  know if any problems.

## 2024-01-14 NOTE — Telephone Encounter (Signed)
 Ok to send in start dose of zepbound  for her.

## 2024-02-10 ENCOUNTER — Telehealth: Payer: Self-pay

## 2024-02-10 ENCOUNTER — Other Ambulatory Visit (HOSPITAL_COMMUNITY): Payer: Self-pay

## 2024-02-10 NOTE — Telephone Encounter (Signed)
 Prior Authorization form/request for Zepbound  5MG /0.5ML pen-injectors IN CMM  asks a question that requires your assistance.. The PA will not be SUBMITTED DUE TO CAN NOT VERIFY IF PT HAS INSURANCE TEST CLAIM AND CHECK PHARMACY BENEFITS IN CHART IT SHOWS NO PHARMACY COVERAGE   PLEASE BE ADVISED

## 2024-02-11 ENCOUNTER — Other Ambulatory Visit (HOSPITAL_COMMUNITY): Payer: Self-pay

## 2024-02-11 NOTE — Telephone Encounter (Signed)
 LM for patient to confirm she is taking zepbound  and that she still has the same insurance.

## 2024-02-16 NOTE — Telephone Encounter (Signed)
Called patient.  Phone line busy

## 2024-02-17 NOTE — Telephone Encounter (Signed)
 Patient says that she is taking zepbound  and was able to pick up her prescription for $25.

## 2024-03-14 ENCOUNTER — Other Ambulatory Visit: Payer: Self-pay | Admitting: Family Medicine

## 2024-03-14 DIAGNOSIS — M5412 Radiculopathy, cervical region: Secondary | ICD-10-CM

## 2024-03-18 ENCOUNTER — Ambulatory Visit
Admission: RE | Admit: 2024-03-18 | Discharge: 2024-03-18 | Disposition: A | Discharge status: Home / Self Care | Source: Ambulatory Visit | Attending: Family Medicine | Admitting: Family Medicine

## 2024-03-18 DIAGNOSIS — M5412 Radiculopathy, cervical region: Secondary | ICD-10-CM

## 2024-03-24 ENCOUNTER — Encounter: Payer: Self-pay | Admitting: Internal Medicine

## 2024-03-24 DIAGNOSIS — M542 Cervicalgia: Secondary | ICD-10-CM | POA: Insufficient documentation

## 2024-03-27 ENCOUNTER — Other Ambulatory Visit: Payer: Self-pay | Admitting: Family Medicine

## 2024-03-27 DIAGNOSIS — M5412 Radiculopathy, cervical region: Secondary | ICD-10-CM

## 2024-04-10 ENCOUNTER — Other Ambulatory Visit

## 2024-05-03 ENCOUNTER — Other Ambulatory Visit: Payer: Self-pay | Admitting: Internal Medicine

## 2024-05-03 DIAGNOSIS — Z1231 Encounter for screening mammogram for malignant neoplasm of breast: Secondary | ICD-10-CM

## 2024-06-07 ENCOUNTER — Ambulatory Visit
Admission: RE | Admit: 2024-06-07 | Discharge: 2024-06-07 | Disposition: A | Discharge status: Home / Self Care | Source: Ambulatory Visit | Attending: Internal Medicine | Admitting: Internal Medicine

## 2024-06-07 DIAGNOSIS — Z1231 Encounter for screening mammogram for malignant neoplasm of breast: Secondary | ICD-10-CM | POA: Insufficient documentation

## 2024-08-28 ENCOUNTER — Other Ambulatory Visit: Payer: Self-pay | Admitting: Internal Medicine

## 2024-09-18 ENCOUNTER — Other Ambulatory Visit: Payer: Self-pay | Admitting: Internal Medicine

## 2024-09-20 ENCOUNTER — Telehealth: Payer: Self-pay

## 2024-09-20 NOTE — Telephone Encounter (Signed)
 Called pt to get her scheduled she stated she was currently in a car and couldn't schedule at the moment but that she would call us  back to schedule. I informed pt that that was perfectly fine and that as soon as she got scheduled we can send in her refills.    E2C2 PLEASE HAVE PT SCHEDULED WITH PCP AND ROUTE A MSG BACK ONCE DONE SO REFILLS CAN BE SENT IN FOR PT

## 2024-09-20 NOTE — Telephone Encounter (Signed)
 Called pt to get her scheduled she stated she was currently in a car and couldn't schedule at the moment but that she would call us  back to schedule. I informed pt that that was perfectly fine and that as soon as she got scheduled we can send in her refills.

## 2024-09-22 ENCOUNTER — Encounter: Payer: Self-pay | Admitting: Internal Medicine

## 2024-09-27 ENCOUNTER — Telehealth: Payer: Self-pay | Admitting: Internal Medicine

## 2024-09-27 DIAGNOSIS — K5000 Crohn's disease of small intestine without complications: Secondary | ICD-10-CM

## 2024-09-27 DIAGNOSIS — Z13220 Encounter for screening for lipoid disorders: Secondary | ICD-10-CM

## 2024-09-27 DIAGNOSIS — K760 Fatty (change of) liver, not elsewhere classified: Secondary | ICD-10-CM

## 2024-09-27 NOTE — Telephone Encounter (Signed)
 I received a secure chat with request for refill for citalopram . Needs a physical scheduled. Ok to refill citalopram  x 1. Please schedule a physical.

## 2024-09-28 NOTE — Telephone Encounter (Signed)
 Orders placed for labs. Needs to keep 10/30/24 appt for physical

## 2024-09-28 NOTE — Addendum Note (Signed)
 Addended by: GLENDIA ALLENA RAMAN on: 09/28/2024 08:55 PM   Modules accepted: Orders

## 2024-09-28 NOTE — Telephone Encounter (Signed)
 Called pt got her scheduled for annual  10/30/2024. She needs lab and she requested [facility]. Which labs should I order for her?

## 2024-10-30 ENCOUNTER — Ambulatory Visit: Admitting: Internal Medicine
